# Patient Record
Sex: Female | Born: 1975 | Race: White | Hispanic: No | Marital: Married | Smoking: Never smoker
Health system: Southern US, Community
[De-identification: ages and names within clinical notes are randomized; demographics above are authoritative.]

## PROBLEM LIST (undated history)

## (undated) DIAGNOSIS — M2011 Hallux valgus (acquired), right foot: Secondary | ICD-10-CM

## (undated) DIAGNOSIS — N8110 Cystocele, unspecified: Secondary | ICD-10-CM

## (undated) DIAGNOSIS — K219 Gastro-esophageal reflux disease without esophagitis: Secondary | ICD-10-CM

## (undated) DIAGNOSIS — N838 Other noninflammatory disorders of ovary, fallopian tube and broad ligament: Secondary | ICD-10-CM

## (undated) DIAGNOSIS — N816 Rectocele: Secondary | ICD-10-CM

## (undated) HISTORY — DX: Gastro-esophageal reflux disease without esophagitis: K21.9

## (undated) HISTORY — PX: OTHER SURGICAL HISTORY: SHX169

## (undated) HISTORY — PX: ABDOMINAL HYSTERECTOMY: SHX81

---

## 2021-07-22 ENCOUNTER — Ambulatory Visit (INDEPENDENT_AMBULATORY_CARE_PROVIDER_SITE_OTHER): Payer: BC Managed Care – PPO | Admitting: Obstetrics

## 2021-07-22 ENCOUNTER — Other Ambulatory Visit (HOSPITAL_COMMUNITY)
Admission: RE | Admit: 2021-07-22 | Discharge: 2021-07-22 | Disposition: A | Discharge status: Home / Self Care | Payer: BC Managed Care – PPO | Source: Ambulatory Visit | Attending: Obstetrics | Admitting: Obstetrics

## 2021-07-22 ENCOUNTER — Encounter: Payer: Self-pay | Admitting: Obstetrics

## 2021-07-22 ENCOUNTER — Other Ambulatory Visit: Payer: Self-pay

## 2021-07-22 VITALS — BP 100/70 | Ht 60.0 in | Wt 116.0 lb

## 2021-07-22 DIAGNOSIS — Z124 Encounter for screening for malignant neoplasm of cervix: Secondary | ICD-10-CM | POA: Diagnosis present

## 2021-07-22 DIAGNOSIS — Z01419 Encounter for gynecological examination (general) (routine) without abnormal findings: Secondary | ICD-10-CM

## 2021-07-22 DIAGNOSIS — Z1231 Encounter for screening mammogram for malignant neoplasm of breast: Secondary | ICD-10-CM

## 2021-07-22 DIAGNOSIS — Z0000 Encounter for general adult medical examination without abnormal findings: Secondary | ICD-10-CM | POA: Diagnosis not present

## 2021-07-22 DIAGNOSIS — R5383 Other fatigue: Secondary | ICD-10-CM

## 2021-07-22 NOTE — Progress Notes (Signed)
Gynecology Annual Exam  PCP: Pcp, No  Chief Complaint:  Chief Complaint  Patient presents with   Annual Exam    History of Present Illness: Patient is a 45 y.o. [id number] presents for annual exam. The patient has no complaints today.  Mallory Jenkins moved to the area from [place] a few months ago. 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 She is married and has two sons who are college age. In 2021, she had a partial hysterectomy ( has her ovaries) after an endometrial biposy revealed abnormal cells. She is physically active and runs several days /weekly, but shares that this year she has begun to feel tired and not as in shape. She has not yest extablished care with a PCP.  LMP: No LMP recorded.   The patient is sexually active. She currently uses status post hysterectomy for contraception. She denies dyspareunia.  The patient does perform self breast exams.  There is no notable family history of breast or ovarian cancer in her family.  The patient wears seatbelts: yes.   The patient has regular exercise: yes.    The patient denies current symptoms of depression.    Review of Systems: Review of Systems  Constitutional: Negative.   HENT: Negative.    Eyes: Negative.   Respiratory: Negative.    Cardiovascular: Negative.   Gastrointestinal: Negative.   Musculoskeletal: Negative.   Skin: Negative.   Neurological: Negative.   Endo/Heme/Allergies: Negative.   Psychiatric/Behavioral: Negative.     Past Medical History:  There are no problems to display for this patient.   Past Surgical History:  Past Surgical History:  Procedure Laterality Date   ABDOMINAL HYSTERECTOMY      Gynecologic History:  No LMP recorded. Contraception: status post hysterectomy Last Pap: Results were: 2020  results unavailable   Last mammogram: 2021 Results were: BI-RAD I  Obstetric History: [id number]  Family History:  History reviewed. No pertinent family history.  Social  History:  Social History   Socioeconomic History   Marital status: Married    Spouse name: Not on file   Number of children: Not on file   Years of education: Not on file   Highest education level: Not on file  Occupational History   Not on file  Tobacco Use   Smoking status: Never   Smokeless tobacco: Never  Vaping Use   Vaping Use: Never used  Substance and Sexual Activity   Alcohol use: Never   Drug use: Never   Sexual activity: Yes  Other Topics Concern   Not on file  Social History Narrative   Not on file   Social Determinants of Health   Financial Resource Strain: Not on file  Food Insecurity: Not on file  Transportation Needs: Not on file  Physical Activity: Not on file  Stress: Not on file  Social Connections: Not on file  Intimate Partner Violence: Not on file    Allergies:  No Known Allergies  Medications: Prior to Admission medications   Not on File    Physical Exam Vitals: Blood pressure 100/70, height 5' (1.524 m), weight 116 lb (52.6 kg).  General: NAD HEENT: normocephalic, anicteric Thyroid: no enlargement, no palpable nodules Pulmonary: No increased work of breathing, CTAB Cardiovascular: RRR, distal pulses 2+ Breast: Breast symmetrical, no tenderness, no palpable nodules or masses, no skin or nipple retraction present, no nipple discharge.  No axillary or supraclavicular lymphadenopathy. Abdomen: NABS, soft, non-tender, non-distended.  Umbilicus without lesions.  No  hepatomegaly, splenomegaly or masses palpable. No evidence of hernia  Genitourinary:  External: Normal external female genitalia.  Normal urethral meatus, normal Bartholin's and Skene's glands.    Vagina: Normal vaginal mucosa, no evidence of prolapse.    Cervix: Grossly normal in appearance, no bleeding  Uterus: Non-enlarged, mobile, normal contour.  No CMT  Adnexa: ovaries non-enlarged, no adnexal masses  Rectal: deferred  Lymphatic: no evidence of inguinal  lymphadenopathy Extremities: no edema, erythema, or tenderness Neurologic: Grossly intact Psychiatric: mood appropriate, affect full  Female chaperone present for pelvic and breast  portions of the physical exam    Assessment: 45 y.o. G2P2002 routine annual exam  Plan: Problem List Items Addressed This Visit   None Visit Diagnoses     Healthcare maintenance    -  Primary   Relevant Orders   Comprehensive metabolic panel   Vitamin D (25 hydroxy)   TSH + free T4   CBC With Differential   Women's annual routine gynecological examination       Relevant Orders   Comprehensive metabolic panel   Vitamin D (25 hydroxy)   Cervical cancer screening       Relevant Orders   Cytology - PAP   Breast cancer screening by mammogram       Relevant Orders   MM DIGITAL SCREENING BILATERAL   Fatigue, unspecified type       Relevant Orders   TSH + free T4       1) Mammogram - recommend yearly screening mammogram.  Mammogram Was ordered today   2) STI screening  wasoffered and declined  3) ASCCP guidelines and rational discussed.  Patient opts for every 5 years screening interval  4) Contraception - the patient is currently using  status post hysterectomy.  She is happy with her current form of contraception and plans to continue  5) Colonoscopy -- Screening recommended starting at age 49 for average risk individuals, age 1 for individuals deemed at increased risk (including African Americans) and recommended to continue until age 87.  For patient age 22-85 individualized approach is recommended.  Gold standard screening is via colonoscopy, Cologuard screening is an acceptable alternative for patient unwilling or unable to undergo colonoscopy.  "Colorectal cancer screening for average?risk adults: 2018 guideline update from the American Cancer Society"CA: A Cancer Journal for Clinicians: Apr 19, 2017   6) Routine healthcare maintenance including cholesterol, diabetes screening discussed  Ordered today, including CBC, CMP, Thyroid panel and Vitamin D.  Will notify her of the results.  7) Return in about 1 year (around 07/22/2022) for annual.   Mirna Mires, CNM  07/22/2021 5:50 PM   [facility], [facility] 07/22/2021, 5:42 PM

## 2021-07-26 LAB — CBC WITH DIFFERENTIAL
Basophils Absolute: 0.1 10*3/uL (ref 0.0–0.2)
Basos: 1 %
EOS (ABSOLUTE): 0.2 10*3/uL (ref 0.0–0.4)
Eos: 2 %
Hematocrit: 44.9 % (ref 34.0–46.6)
Hemoglobin: 15 g/dL (ref 11.1–15.9)
Immature Grans (Abs): 0 10*3/uL (ref 0.0–0.1)
Immature Granulocytes: 0 %
Lymphocytes Absolute: 2.5 10*3/uL (ref 0.7–3.1)
Lymphs: 31 %
MCH: 30.4 pg (ref 26.6–33.0)
MCHC: 33.4 g/dL (ref 31.5–35.7)
MCV: 91 fL (ref 79–97)
Monocytes Absolute: 0.5 10*3/uL (ref 0.1–0.9)
Monocytes: 6 %
Neutrophils Absolute: 4.9 10*3/uL (ref 1.4–7.0)
Neutrophils: 60 %
RBC: 4.93 x10E6/uL (ref 3.77–5.28)
RDW: 11.6 % — ABNORMAL LOW (ref 11.7–15.4)
WBC: 8.1 10*3/uL (ref 3.4–10.8)

## 2021-07-26 LAB — COMPREHENSIVE METABOLIC PANEL
ALT: 18 IU/L (ref 0–32)
AST: 22 IU/L (ref 0–40)
Albumin/Globulin Ratio: 1.9 (ref 1.2–2.2)
Albumin: 4.5 g/dL (ref 3.8–4.8)
Alkaline Phosphatase: 55 IU/L (ref 44–121)
BUN/Creatinine Ratio: 23 (ref 9–23)
BUN: 16 mg/dL (ref 6–24)
Bilirubin Total: 0.4 mg/dL (ref 0.0–1.2)
CO2: 22 mmol/L (ref 20–29)
Calcium: 9.5 mg/dL (ref 8.7–10.2)
Chloride: 101 mmol/L (ref 96–106)
Creatinine, Ser: 0.71 mg/dL (ref 0.57–1.00)
Globulin, Total: 2.4 g/dL (ref 1.5–4.5)
Glucose: 82 mg/dL (ref 65–99)
Potassium: 3.8 mmol/L (ref 3.5–5.2)
Sodium: 139 mmol/L (ref 134–144)
Total Protein: 6.9 g/dL (ref 6.0–8.5)
eGFR: 105 mL/min/{1.73_m2} (ref >59)

## 2021-07-26 LAB — TSH+FREE T4
Free T4: 1.03 ng/dL (ref 0.82–1.77)
TSH: 1.39 u[IU]/mL (ref 0.450–4.500)

## 2021-07-26 LAB — VITAMIN D 25 HYDROXY (VIT D DEFICIENCY, FRACTURES): Vit D, 25-Hydroxy: 35.2 ng/mL (ref 30.0–100.0)

## 2021-07-28 ENCOUNTER — Encounter: Payer: Self-pay | Admitting: Obstetrics

## 2021-07-28 LAB — CYTOLOGY - PAP
Comment: NEGATIVE
Diagnosis: NEGATIVE
High risk HPV: NEGATIVE

## 2021-08-11 ENCOUNTER — Other Ambulatory Visit: Payer: Self-pay | Admitting: Obstetrics

## 2021-08-11 DIAGNOSIS — Z1231 Encounter for screening mammogram for malignant neoplasm of breast: Secondary | ICD-10-CM

## 2021-08-24 ENCOUNTER — Ambulatory Visit
Admission: RE | Admit: 2021-08-24 | Discharge: 2021-08-24 | Disposition: A | Discharge status: Home / Self Care | Payer: BC Managed Care – PPO | Source: Ambulatory Visit | Attending: Obstetrics | Admitting: Obstetrics

## 2021-08-24 ENCOUNTER — Other Ambulatory Visit: Payer: Self-pay

## 2021-08-24 DIAGNOSIS — Z1231 Encounter for screening mammogram for malignant neoplasm of breast: Secondary | ICD-10-CM | POA: Diagnosis not present

## 2021-09-01 ENCOUNTER — Encounter: Payer: Self-pay | Admitting: Obstetrics

## 2021-11-17 ENCOUNTER — Emergency Department: Payer: BC Managed Care – PPO

## 2021-11-17 ENCOUNTER — Ambulatory Visit
Admission: EM | Admit: 2021-11-17 | Discharge: 2021-11-17 | Disposition: A | Discharge status: Home / Self Care | Payer: BC Managed Care – PPO | Attending: Emergency Medicine | Admitting: Emergency Medicine

## 2021-11-17 ENCOUNTER — Emergency Department: Payer: BC Managed Care – PPO | Admitting: Anesthesiology

## 2021-11-17 ENCOUNTER — Encounter: Payer: Self-pay | Admitting: Emergency Medicine

## 2021-11-17 ENCOUNTER — Other Ambulatory Visit: Payer: Self-pay

## 2021-11-17 ENCOUNTER — Encounter
Admission: EM | Disposition: A | Discharge status: Home / Self Care | Payer: Self-pay | Source: Home / Self Care | Attending: Student in an Organized Health Care Education/Training Program

## 2021-11-17 DIAGNOSIS — K209 Esophagitis, unspecified without bleeding: Secondary | ICD-10-CM | POA: Diagnosis not present

## 2021-11-17 DIAGNOSIS — X58XXXA Exposure to other specified factors, initial encounter: Secondary | ICD-10-CM | POA: Diagnosis not present

## 2021-11-17 DIAGNOSIS — Z20822 Contact with and (suspected) exposure to covid-19: Secondary | ICD-10-CM | POA: Diagnosis not present

## 2021-11-17 DIAGNOSIS — K3189 Other diseases of stomach and duodenum: Secondary | ICD-10-CM | POA: Insufficient documentation

## 2021-11-17 DIAGNOSIS — T18128A Food in esophagus causing other injury, initial encounter: Secondary | ICD-10-CM | POA: Insufficient documentation

## 2021-11-17 DIAGNOSIS — K2289 Other specified disease of esophagus: Secondary | ICD-10-CM | POA: Insufficient documentation

## 2021-11-17 HISTORY — PX: ESOPHAGOGASTRODUODENOSCOPY (EGD) WITH PROPOFOL: SHX5813

## 2021-11-17 LAB — RESP PANEL BY RT-PCR (FLU A&B, COVID) ARPGX2
Influenza A by PCR: NEGATIVE
Influenza B by PCR: NEGATIVE
SARS Coronavirus 2 by RT PCR: NEGATIVE

## 2021-11-17 SURGERY — ESOPHAGOGASTRODUODENOSCOPY (EGD) WITH PROPOFOL
Anesthesia: General

## 2021-11-17 MED ORDER — LIDOCAINE HCL (CARDIAC) PF 100 MG/5ML IV SOSY
PREFILLED_SYRINGE | INTRAVENOUS | Status: DC | PRN
  Administered 2021-11-17: 50 mg via INTRAVENOUS

## 2021-11-17 MED ORDER — ONDANSETRON HCL 4 MG/2ML IJ SOLN
INTRAMUSCULAR | Status: DC | PRN
  Administered 2021-11-17: 4 mg via INTRAVENOUS

## 2021-11-17 MED ORDER — ROCURONIUM BROMIDE 10 MG/ML (PF) SYRINGE
PREFILLED_SYRINGE | INTRAVENOUS | Status: AC
  Filled 2021-11-17: qty 10

## 2021-11-17 MED ORDER — ROCURONIUM BROMIDE 100 MG/10ML IV SOLN
INTRAVENOUS | Status: DC | PRN
Start: 2021-11-17 — End: 2021-11-17
  Administered 2021-11-17: 20 mg via INTRAVENOUS

## 2021-11-17 MED ORDER — GLUCAGON HCL (RDNA) 1 MG IJ SOLR
1.0000 mg | Freq: Once | INTRAMUSCULAR | Status: AC
  Administered 2021-11-17: 18:00:00 1 mg via INTRAVENOUS
  Filled 2021-11-17 (×2): qty 1

## 2021-11-17 MED ORDER — SUGAMMADEX SODIUM 200 MG/2ML IV SOLN
INTRAVENOUS | Status: DC | PRN
Start: 2021-11-17 — End: 2021-11-17
  Administered 2021-11-17: 150 mg via INTRAVENOUS

## 2021-11-17 MED ORDER — SUCCINYLCHOLINE CHLORIDE 200 MG/10ML IV SOSY
PREFILLED_SYRINGE | INTRAVENOUS | Status: DC | PRN
  Administered 2021-11-17: 50 mg via INTRAVENOUS

## 2021-11-17 MED ORDER — PROPOFOL 10 MG/ML IV BOLUS
INTRAVENOUS | Status: AC
  Filled 2021-11-17: qty 20

## 2021-11-17 MED ORDER — ONDANSETRON HCL 4 MG/2ML IJ SOLN
4.0000 mg | Freq: Once | INTRAMUSCULAR | Status: AC
  Administered 2021-11-17: 18:00:00 4 mg via INTRAVENOUS
  Filled 2021-11-17: qty 2

## 2021-11-17 MED ORDER — SODIUM CHLORIDE 0.9 % IV BOLUS
500.0000 mL | Freq: Once | INTRAVENOUS | Status: AC
  Administered 2021-11-17: 18:00:00 500 mL via INTRAVENOUS

## 2021-11-17 MED ORDER — PROPOFOL 10 MG/ML IV BOLUS
INTRAVENOUS | Status: DC | PRN
  Administered 2021-11-17: 130 mg via INTRAVENOUS
  Administered 2021-11-17: 30 mg via INTRAVENOUS

## 2021-11-17 MED ORDER — DEXMEDETOMIDINE HCL IN NACL 200 MCG/50ML IV SOLN
INTRAVENOUS | Status: AC
  Filled 2021-11-17: qty 50

## 2021-11-17 MED ORDER — SODIUM CHLORIDE 0.9 % IV SOLN: INTRAVENOUS | Status: DC

## 2021-11-17 MED ORDER — DEXMEDETOMIDINE HCL IN NACL 200 MCG/50ML IV SOLN
INTRAVENOUS | Status: DC | PRN
  Administered 2021-11-17: 12 ug via INTRAVENOUS

## 2021-11-17 NOTE — ED Provider Notes (Signed)
°  Emergency Medicine Provider Triage Evaluation Note  Mallory Jenkins , a 45 y.o.female,  was evaluated in triage.  Pt complains of esophageal foreign body.  Patient states that she was eating chicken last night, when she felt it get stuck in her throat.  Denies fever/chills, shortness of breath, chest pain, or nausea   Review of Systems  Positive: Esophageal discomfort Negative: Denies fever, chest pain, vomiting  Physical Exam   Vitals:   11/17/21 1333  BP: (!) 161/66  Pulse: 62  Resp: 16  Temp: 97.9 F (36.6 C)  SpO2: 93%   Gen:   Awake, no distress   Resp:  Normal effort  MSK:   Moves extremities without difficulty  Other:    Medical Decision Making  Given the patient's initial medical screening exam, the following diagnostic evaluation has been ordered. The patient will be placed in the appropriate treatment space, once one is available, to complete the evaluation and treatment. I have discussed the plan of care with the patient and I have advised the patient that an ED physician or mid-level practitioner will reevaluate their condition after the test results have been received, as the results may give them additional insight into the type of treatment they may need.    Diagnostics: Soft tissue neck x-ray  Treatments: none immediately   Varney Daily, PA 11/17/21 1347    Delton Prairie, MD 11/17/21 1734

## 2021-11-17 NOTE — Transfer of Care (Signed)
Immediate Anesthesia Transfer of Care Note  Patient: Mallory Jenkins  Procedure(s) Performed: ESOPHAGOGASTRODUODENOSCOPY (EGD) WITH PROPOFOL  Patient Location: PACU  Anesthesia Type:General  Level of Consciousness: drowsy and patient cooperative  Airway & Oxygen Therapy: Patient Spontanous Breathing  Post-op Assessment: Report given to RN and Post -op Vital signs reviewed and stable  Post vital signs: Reviewed and stable  Last Vitals:  Vitals Value Taken Time  BP 119/80 11/17/21 2009  Temp    Pulse 86 11/17/21 2009  Resp 18 11/17/21 2009  SpO2 98 % 11/17/21 2009  Vitals shown include unvalidated device data.  Last Pain:  Vitals:   11/17/21 1920  TempSrc: Temporal         Complications: No notable events documented.

## 2021-11-17 NOTE — ED Notes (Signed)
Pt has chicken stuck in her throat since 1830 yesterday.  No resp distress.  Pt unable to keep food or drinks down.  Hx of same problem,  pt alert, in hallway bed.

## 2021-11-17 NOTE — ED Notes (Signed)
Report given to lori rn endo nurse

## 2021-11-17 NOTE — Progress Notes (Signed)
Patient awake/alert x4. Able to tolerated water without event,states "so much better" Afebrile. Reviewed endo report with patient, GI RN reviewed with patient's husband. 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 Reviewed protonix 40mg  bid for 12weeks, and repeat EGD in 12 weeks.

## 2021-11-17 NOTE — ED Notes (Signed)
Pt transported to OR for endoscopy by OR endo RN Hilbert Corrigan,

## 2021-11-17 NOTE — ED Triage Notes (Signed)
Presents with possible food bolus  feels like something is stuck in throat

## 2021-11-17 NOTE — Anesthesia Procedure Notes (Signed)
Procedure Name: Intubation Date/Time: 11/17/2021 7:41 PM Performed by: Omer Jack, CRNA Pre-anesthesia Checklist: Patient identified, Patient being monitored, Timeout performed, Emergency Drugs available and Suction available Patient Re-evaluated:Patient Re-evaluated prior to induction Oxygen Delivery Method: Circle system utilized Preoxygenation: Pre-oxygenation with 100% oxygen Induction Type: IV induction Ventilation: Mask ventilation without difficulty Laryngoscope Size: 3 and McGraph Grade View: Grade I Tube type: Oral Tube size: 7.0 mm Number of attempts: 1 Airway Equipment and Method: Stylet Placement Confirmation: ETT inserted through vocal cords under direct vision, positive ETCO2 and breath sounds checked- equal and bilateral Secured at: 21 cm Tube secured with: Tape Dental Injury: Teeth and Oropharynx as per pre-operative assessment

## 2021-11-17 NOTE — Consult Note (Signed)
Consultation  Referring Provider:  ED   Admit date: 12/28 Consult date: 12/28         Reason for Consultation:   Food Bolus           HPI:   Mallory Jenkins is a 45 y.o. lady with history of EoE diagnosed in  about two years ago. Had an episode of food impaction a few months ago she vomited up. She was prescribed protonix but stopped taking it. Had a piece of chicken last night that got stook and has been unable to tolerate any PO intake. No blood thinners. No family history of GI issues.  History reviewed. No pertinent past medical history.  Past Surgical History:  Procedure Laterality Date   ABDOMINAL HYSTERECTOMY      History reviewed. No pertinent family history.   Social History   Tobacco Use   Smoking status: Never   Smokeless tobacco: Never  Vaping Use   Vaping Use: Never used  Substance Use Topics   Alcohol use: Never   Drug use: Never    Prior to Admission medications   Not on File    Current Facility-Administered Medications  Medication Dose Route Frequency Provider Last Rate Last Admin   0.9 %  sodium chloride infusion   Intravenous Continuous Jaymie Misch, Rossie Muskrat, MD       No current outpatient medications on file.    Allergies as of 11/17/2021   (No Known Allergies)     Review of Systems:    All systems reviewed and negative except where noted in HPI.  Review of Systems  Constitutional:  Negative for chills and fever.  Respiratory:  Negative for shortness of breath.   Cardiovascular:  Negative for chest pain.  Gastrointestinal:  Positive for nausea.  Neurological:  Negative for focal weakness.  Psychiatric/Behavioral:  Negative for substance abuse.   All other systems reviewed and are negative.     Physical Exam:  Vital signs in last 24 hours: Temp:  [97.9 F (36.6 C)-98.8 F (37.1 C)] 98.8 F (37.1 C) (12/28 1920) Pulse Rate:  [62-93] 86 (12/28 1920) Resp:  [16] 16 (12/28 1920) BP: (122-161)/(66-90) 125/90 (12/28 1920) SpO2:   [93 %-100 %] 100 % (12/28 1920) Weight:  [52.6 kg] 52.6 kg (12/28 1308)   General:   Pleasant in NAD Head:  Normocephalic and atraumatic. Eyes:   No icterus.   Conjunctiva pink. Mouth: Mucosa pink moist, no lesions. Neck:  Supple; no masses felt Lungs:  No respiratory distress Abdomen:   Flat, soft, nondistended, nontender Msk:   No clubbing or cyanosis. Strength 5/5 Neurologic:  Alert and  oriented x4;  Cranial nerves II-XII intact.  Skin:  Warm, dry, pink without significant lesions or rashes. Psych:  Alert and cooperative. Normal affect.  LAB RESULTS: No results for input(s): WBC, HGB, HCT, PLT in the last 72 hours. BMET No results for input(s): NA, K, CL, CO2, GLUCOSE, BUN, CREATININE, CALCIUM in the last 72 hours. LFT No results for input(s): PROT, ALBUMIN, AST, ALT, ALKPHOS, BILITOT, BILIDIR, IBILI in the last 72 hours. PT/INR No results for input(s): LABPROT, INR in the last 72 hours.  STUDIES: DG Neck Soft Tissue  Result Date: 11/17/2021 CLINICAL DATA:  Possible foreign body EXAM: NECK SOFT TISSUES - 1+ VIEW COMPARISON:  None. FINDINGS: There is no evidence of retropharyngeal soft tissue swelling or epiglottic enlargement. The cervical airway is unremarkable and no radio-opaque foreign body identified. IMPRESSION: No radiopaque foreign body identified. Electronically Signed   By:  Jannifer Hick M.D.   On: 11/17/2021 14:07       Impression / Plan:   45 y/o lady with history of EoE who presents with suspected food impaction  - Plan for EGD now - further recs after procedure   Merlyn Lot MD, MPH [facility]

## 2021-11-17 NOTE — ED Provider Notes (Signed)
[facility] Emergency Department Provider Note    Event Date/Time   First MD Initiated Contact with Patient 11/17/21 1734     (approximate)  I have reviewed the triage vital signs and the nursing notes.   HISTORY  Chief Complaint Foreign Body    HPI Mallory Jenkins is a 46 y.o. female with a history of eosinophilic esophagitis diagnosed in [place] presents to the ER with concern for food bolus impaction she states that she was eating chicken last night and felt something is stuck in her throat.  She has dealt with this in the past she tried to wait it out to drink fluids and try to vomit it up but after waiting throughout the day and still not able to tolerate liquids she came to the ER.  Denies any shortness of breath feels something stuck behind her breastbone has been consistent since she was eating the chicken.  She is on any blood thinners.  History reviewed. No pertinent past medical history. History reviewed. No pertinent family history. Past Surgical History:  Procedure Laterality Date   ABDOMINAL HYSTERECTOMY     There are no problems to display for this patient.     Prior to Admission medications   Not on File    Allergies Patient has no known allergies.    Social History Social History   Tobacco Use   Smoking status: Never   Smokeless tobacco: Never  Vaping Use   Vaping Use: Never used  Substance Use Topics   Alcohol use: Never   Drug use: Never    Review of Systems Patient denies headaches, rhinorrhea, blurry vision, numbness, shortness of breath, chest pain, edema, cough, abdominal pain, nausea, vomiting, diarrhea, dysuria, fevers, rashes or hallucinations unless otherwise stated above in HPI. ____________________________________________   PHYSICAL EXAM:  VITAL SIGNS: Vitals:   11/17/21 1333 11/17/21 1645  BP: (!) 161/66 122/83  Pulse: 62 93  Resp: 16 16  Temp: 97.9 F (36.6 C)   SpO2: 93% 98%     Constitutional: Alert and oriented.  Eyes: Conjunctivae are normal.  Head: Atraumatic. Nose: No congestion/rhinnorhea. Mouth/Throat: Mucous membranes are moist.   Neck: No stridor. Painless ROM.  Cardiovascular: Normal rate, regular rhythm. Grossly normal heart sounds.  Good peripheral circulation. Respiratory: Normal respiratory effort.  No retractions. Lungs CTAB. Gastrointestinal: Soft and nontender. No distention. No abdominal bruits. No CVA tenderness. Genitourinary:  Musculoskeletal: No lower extremity tenderness nor edema.  No joint effusions. Neurologic:  Normal speech and language. No gross focal neurologic deficits are appreciated. No facial droop Skin:  Skin is warm, dry and intact. No rash noted. Psychiatric: Mood and affect are normal. Speech and behavior are normal.  ____________________________________________   LABS (all labs ordered are listed, but only abnormal results are displayed)  No results found for this or any previous visit (from the past 24 hour(s)). ____________________________________________ ____________________________________________  RADIOLOGY   ____________________________________________   PROCEDURES  Procedure(s) performed:  Procedures    Critical Care performed: no ____________________________________________   INITIAL IMPRESSION / ASSESSMENT AND PLAN / ED COURSE  Pertinent labs & imaging results that were available during my care of the patient were reviewed by me and considered in my medical decision making (see chart for details).   DDX: fbi, esophagitis, eoe, ptx  Mallory Jenkins is a 45 y.o. who presents to the ED with symptoms consistent with food bolus impaction by history and given her history of the same with similar symptoms.  Have consulted GI, dr.  Locklear, who will evaluate patient.  Will give IV fluids as she does feel dehydrated will give a trial of glucagon while awaiting GI consultation  Clinical Course as of  11/17/21 1849  Wed Nov 17, 2021  1848 Patient felt some change in symptoms after glucagon and therefore we attempted a p.o. challenge again but patient is still having liquid coming back up.  She is currently protecting her airway [PR]    Clinical Course User Index [PR] Willy Eddy, MD    The patient was evaluated in Emergency Department today for the symptoms described in the history of present illness. He/she was evaluated in the context of the global COVID-19 pandemic, which necessitated consideration that the patient might be at risk for infection with the SARS-CoV-2 virus that causes COVID-19. Institutional protocols and algorithms that pertain to the evaluation of patients at risk for COVID-19 are in a state of rapid change based on information released by regulatory bodies including the CDC and federal and state organizations. These policies and algorithms were followed during the patient's care in the ED.  As part of my medical decision making, I reviewed the following data within the electronic MEDICAL RECORD NUMBER Nursing notes reviewed and incorporated, Labs reviewed, notes from prior ED visits and [place] Controlled Substance Database   ____________________________________________   FINAL CLINICAL IMPRESSION(S) / ED DIAGNOSES  Final diagnoses:  Food impaction of esophagus, initial encounter      NEW MEDICATIONS STARTED DURING THIS VISIT:  New Prescriptions   No medications on file     Note:  This document was prepared using Dragon voice recognition software and may include unintentional dictation errors.    Willy Eddy, MD 11/17/21 [phone]

## 2021-11-17 NOTE — ED Notes (Signed)
Pt unable to keep ginger ale down.

## 2021-11-17 NOTE — Op Note (Signed)
[facility] Gastroenterology Patient Name: Mallory Jenkins Procedure Date: 11/17/2021 7:39 PM MRN: [id number] Account #: [id number] Date of Birth: Sep 27, 1976 Admit Type: Outpatient Age: 45 Room: [facility] ENDO ROOM 4 Gender: Female Note Status: Finalized Instrument Name: Altamese Cabal Endoscope [id number] Procedure:             Upper GI endoscopy Indications:           Foreign body in the esophagus Providers:             Andrey Farmer MD, MD Medicines:             General Anesthesia Complications:         No immediate complications. Estimated blood loss:                         Minimal. Procedure:             Pre-Anesthesia Assessment:                        - Prior to the procedure, a History and Physical was                         performed, and patient medications and allergies were                         reviewed. The patient is competent. The risks and                         benefits of the procedure and the sedation options and                         risks were discussed with the patient. All questions                         were answered and informed consent was obtained.                         Patient identification and proposed procedure were                         verified by the physician, the nurse, the                         anesthesiologist, the anesthetist and the technician                         in the endoscopy suite. Mental Status Examination:                         alert and oriented. Airway Examination: normal                         oropharyngeal airway and neck mobility. Respiratory                         Examination: clear to auscultation. CV Examination:                         normal. Prophylactic Antibiotics: The patient does not  require prophylactic antibiotics. Prior                         Anticoagulants: The patient has taken no previous                         anticoagulant or antiplatelet agents. ASA Grade                          Assessment: E - Emergency. After reviewing the risks                         and benefits, the patient was deemed in satisfactory                         condition to undergo the procedure. The anesthesia                         plan was to use monitored anesthesia care (MAC).                         Immediately prior to administration of medications,                         the patient was re-assessed for adequacy to receive                         sedatives. The heart rate, respiratory rate, oxygen                         saturations, blood pressure, adequacy of pulmonary                         ventilation, and response to care were monitored                         throughout the procedure. The physical status of the                         patient was re-assessed after the procedure.                        After obtaining informed consent, the endoscope was                         passed under direct vision. Throughout the procedure,                         the patient's blood pressure, pulse, and oxygen                         saturations were monitored continuously. The Endoscope                         was introduced through the mouth, and advanced to the                         second part of duodenum. The upper GI endoscopy was  accomplished without difficulty. The patient tolerated                         the procedure well. Findings:      Food was found in the lower third of the esophagus. Removal of food was       accomplished using a roth net and then gentle pressure to push remaining       amount into the stomach.      Multiple 2 mm plaques were found in the middle third of the esophagus.       These can be biopsied on f/u endoscopy      LA Grade D (one or more mucosal breaks involving at least 75% of       esophageal circumference) esophagitis with bleeding was found.      The entire examined stomach was normal.      Patchy  atrophic mucosa was found in the entire duodenum. This can be       biopsied on f/u endoscopy. Impression:            - Food in the lower third of the esophagus. Removal                         was successful.                        - Multiple plaques in the middle third of the                         esophagus.                        - LA Grade D esophagitis with bleeding.                        - Normal stomach.                        - Duodenal mucosal atrophy. Recommendation:        - Discharge patient to home.                        - Soft diet for 2 days.                        - Use Protonix (pantoprazole) 40 mg PO BID for 12                         weeks.                        - Repeat upper endoscopy in 12 weeks to check healing                         and to assess disease activity.                        - Take small bites. Chew food thoroughly. Procedure Code(s):     --- Professional ---                        [phone], Esophagogastroduodenoscopy, flexible,  transoral; with removal of foreign body(s) Diagnosis Code(s):     --- Professional ---                        M33.612A, Food in esophagus causing other injury,                         initial encounter                        K22.8, Other specified diseases of esophagus                        K20.91, Esophagitis, unspecified with bleeding                        K31.89, Other diseases of stomach and duodenum                        T18.108A, Unspecified foreign body in esophagus                         causing other injury, initial encounter CPT copyright 2019 American Medical Association. All rights reserved. The codes documented in this report are preliminary and upon coder review may  be revised to meet current compliance requirements. Andrey Farmer MD, MD 11/17/2021 8:12:55 PM Number of Addenda: 0 Note Initiated On: 11/17/2021 7:39 PM Estimated Blood Loss:  Estimated blood loss was minimal.       [facility]

## 2021-11-17 NOTE — Anesthesia Postprocedure Evaluation (Signed)
Anesthesia Post Note  Patient: Visual merchandiser  Procedure(s) Performed: ESOPHAGOGASTRODUODENOSCOPY (EGD) WITH PROPOFOL  Patient location during evaluation: PACU Anesthesia Type: General Level of consciousness: awake and alert Pain management: pain level controlled Vital Signs Assessment: post-procedure vital signs reviewed and stable Respiratory status: spontaneous breathing, nonlabored ventilation and respiratory function stable Cardiovascular status: blood pressure returned to baseline and stable Postop Assessment: no apparent nausea or vomiting Anesthetic complications: no   No notable events documented.   Last Vitals:  Vitals:   11/17/21 2015 11/17/21 2025  BP: 107/76 119/80  Pulse: 84 82  Resp: 16 16  Temp: 36.6 C 36.4 C  SpO2: 100% 99%    Last Pain:  Vitals:   11/17/21 2025  TempSrc:   PainSc: 0-No pain                 Foye Deer

## 2021-11-17 NOTE — Anesthesia Preprocedure Evaluation (Addendum)
Anesthesia Evaluation  Patient identified by MRN, date of birth, ID band Patient awake    Reviewed: Allergy & Precautions, NPO status , Patient's Chart, lab work & pertinent test results  Airway Mallampati: II  TM Distance: >3 FB Neck ROM: Full    Dental no notable dental hx.    Pulmonary neg pulmonary ROS,    Pulmonary exam normal breath sounds clear to auscultation       Cardiovascular negative cardio ROS Normal cardiovascular exam Rhythm:Regular Rate:Normal     Neuro/Psych negative neurological ROS  negative psych ROS   GI/Hepatic Neg liver ROS,   Endo/Other  negative endocrine ROS  Renal/GU negative Renal ROS  negative genitourinary   Musculoskeletal negative musculoskeletal ROS (+)   Abdominal   Peds negative pediatric ROS (+)  Hematology negative hematology ROS (+)   Anesthesia Other Findings Food bolus Pt has a history of eosinophilic esophagitis diagnosed in [place] presents to the ER with concern for food bolus impaction she states that she was eating chicken last night and felt something is stuck in her throat. She does not tolerate liquids.  Reproductive/Obstetrics negative OB ROS                             Anesthesia Physical Anesthesia Plan  ASA: 1  Anesthesia Plan: General   Post-op Pain Management:    Induction: Intravenous and Rapid sequence  PONV Risk Score and Plan: Ondansetron  Airway Management Planned: Oral ETT  Additional Equipment:   Intra-op Plan:   Post-operative Plan:   Informed Consent: I have reviewed the patients History and Physical, chart, labs and discussed the procedure including the risks, benefits and alternatives for the proposed anesthesia with the patient or authorized representative who has indicated his/her understanding and acceptance.     Dental advisory given  Plan Discussed with: CRNA and Anesthesiologist  Anesthesia  Plan Comments:         Anesthesia Quick Evaluation

## 2021-11-18 ENCOUNTER — Encounter: Payer: Self-pay | Admitting: Gastroenterology

## 2022-01-26 ENCOUNTER — Other Ambulatory Visit: Payer: Self-pay

## 2022-01-26 ENCOUNTER — Ambulatory Visit: Payer: Self-pay | Admitting: Gastroenterology

## 2022-01-26 ENCOUNTER — Encounter: Payer: Self-pay | Admitting: Gastroenterology

## 2022-01-26 ENCOUNTER — Ambulatory Visit (INDEPENDENT_AMBULATORY_CARE_PROVIDER_SITE_OTHER): Payer: BC Managed Care – PPO | Admitting: Gastroenterology

## 2022-01-26 VITALS — BP 108/72 | HR 76 | Temp 98.2°F | Ht 60.0 in | Wt 119.4 lb

## 2022-01-26 DIAGNOSIS — K200 Eosinophilic esophagitis: Secondary | ICD-10-CM | POA: Diagnosis not present

## 2022-01-26 DIAGNOSIS — Z1211 Encounter for screening for malignant neoplasm of colon: Secondary | ICD-10-CM | POA: Diagnosis not present

## 2022-01-26 NOTE — Progress Notes (Signed)
?  ?Wyline Mood MD, MRCP(U.K) ?[address]  ?[address]  ?[place], [place] [address]  ?Main: [phone]  ?Fax: [phone] ? ? ?Gastroenterology Consultation ? ?Referring Provider:     No ref. provider found ?Primary Care Physician:  Pcp, No ?Primary Gastroenterologist:  Dr. Wyline Mood  ?Reason for Consultation:     Dysphagua ?      ? HPI:   ?Mallory Jenkins is a 46 y.o. y/o female is here today to see me for dysphagia. She apparently has a history of EOE diagnosed in [place] 2 years back and came into the ER on 11/17/2021 with a piece of chicken stuck in her esophagus. Had an EGD by Dr Mia Creek : LA grade D esophagitis seen , biopsies of esophagus not take, food bolus taken out, she requested to be seen by me as an outpatient .  ? 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  She suffers from seasonal allergies does not have any clear food allergies.  She has not had a colonoscopy for colon cancer screening is due for 1.  No lower GI symptoms.  Has been taking omeprazole 40 mg twice a day and has not noticed any change in her symptoms. ? ?No past medical history on file. ? ?Past Surgical History:  ?Procedure Laterality Date  ? ABDOMINAL HYSTERECTOMY    ? ESOPHAGOGASTRODUODENOSCOPY (EGD) WITH PROPOFOL N/A 11/17/2021  ? Procedure: ESOPHAGOGASTRODUODENOSCOPY (EGD) WITH PROPOFOL;  Surgeon: Regis Bill, MD;  Location: [facility] ENDOSCOPY;  Service: Endoscopy;  Laterality: N/A;  ? ? ?Prior to Admission medications   ?Medication Sig Start Date End Date Taking? Authorizing Provider  ?pantoprazole (PROTONIX) 40 MG tablet Take 1 tablet by mouth in the morning and at bedtime. 11/23/21   [provider]  ? ? ?No family history on file.  ? ?Social History  ? ?Tobacco Use  ? Smoking status: Never  ? Smokeless tobacco: Never  ?Vaping Use  ? Vaping Use: Never used  ?Substance Use  Topics  ? Alcohol use: Never  ? Drug use: Never  ? ? ?Allergies as of 01/26/2022  ? (No Known Allergies)  ? ? ?Review of Systems:    ?All systems reviewed and negative except where noted in HPI. ? ? Physical Exam:  ?There were no vitals taken for this visit. ?No LMP recorded. Patient has had a hysterectomy. ?Psych:  Alert and cooperative. Normal mood and affect. ?General:   Alert,  Well-developed, well-nourished, pleasant and cooperative in NAD ?Head:  Normocephalic and atraumatic. ?Eyes:  Sclera clear, no icterus.   Conjunctiva pink. ?Ears:  Normal auditory acuity. ?Neurologic:  Alert and oriented x3;  grossly normal neurologically. ?Psych:  Alert and cooperative. Normal mood and affect. ? ?Imaging Studies: ?No results found. ? ?Assessment and Plan:  ? ?Mallory Jenkins is a 46 y.o. y/o female has been referred for history of eosinophilic esophagitis.  Recent presentation emergency room and December 2022 with a food impaction.  Not been treated for eosinophilic esophagitis or check for food allergies in the past.  Due for colon cancer screening colonoscopy.  No lower GI symptoms. ? ? ?Plan  ?EGD to obtain biopsies for eosinophilic esophagitis.  Will discuss about treatment after results.  Discussed about food allergy testing. ?Continue Prilosec 40 mg BID ? ?I have discussed alternative options, risks & benefits,  which include, but are not limited  to, bleeding, infection, perforation,respiratory complication & drug reaction.  The patient agrees with this plan & written consent will be obtained.   ? ? ?Follow up in 8-12 weeks  ? ?Dr Wyline Mood MD,MRCP(U.K) ?\ ?

## 2022-01-29 LAB — FOOD ALLERGY PROFILE
Allergen Corn, IgE: <0.1 kU/L
Clam IgE: <0.1 kU/L
Codfish IgE: <0.1 kU/L
Egg White IgE: <0.1 kU/L
Milk IgE: 0.13 kU/L — AB
Peanut IgE: <0.1 kU/L
Scallop IgE: <0.1 kU/L
Sesame Seed IgE: <0.1 kU/L
Shrimp IgE: <0.1 kU/L
Soybean IgE: <0.1 kU/L
Walnut IgE: <0.1 kU/L
Wheat IgE: <0.1 kU/L

## 2022-01-29 NOTE — Progress Notes (Signed)
Food allergy profile showed very mild possible allergy to milk but nothing much else

## 2022-01-31 ENCOUNTER — Telehealth: Payer: Self-pay

## 2022-01-31 NOTE — Telephone Encounter (Signed)
Called patient but had to leave her a detailed message letting her know that Dr. Tobi Bastos reviewed her labs and that the food allergy profile showed mild possibility allergy to milk but nothing else. I told her to call us if she had further questions. ?

## 2022-01-31 NOTE — Telephone Encounter (Signed)
-----   Message from Wyline Mood, MD sent at 01/29/2022  1:20 PM EST ----- ?Food allergy profile showed very mild possible allergy to milk but nothing much else  ?

## 2022-02-07 ENCOUNTER — Ambulatory Visit: Payer: Self-pay | Admitting: Adult Health

## 2022-04-05 ENCOUNTER — Telehealth: Payer: Self-pay | Admitting: Gastroenterology

## 2022-04-05 NOTE — Telephone Encounter (Signed)
PT left message to set appointment for colonoscopy ?

## 2022-04-06 ENCOUNTER — Telehealth: Payer: Self-pay

## 2022-04-06 ENCOUNTER — Other Ambulatory Visit: Payer: Self-pay

## 2022-04-06 DIAGNOSIS — K200 Eosinophilic esophagitis: Secondary | ICD-10-CM

## 2022-04-06 DIAGNOSIS — Z1211 Encounter for screening for malignant neoplasm of colon: Secondary | ICD-10-CM

## 2022-04-06 MED ORDER — NA SULFATE-K SULFATE-MG SULF 17.5-3.13-1.6 GM/177ML PO SOLN: 1.0000 | Freq: Once | ORAL | 0 refills | Status: AC

## 2022-04-06 NOTE — Telephone Encounter (Signed)
Gastroenterology Pre-Procedure Review ? ?Request Date: 05/13/22 ?Requesting Physician: Dr. Tobi Bastos ? ?PATIENT REVIEW QUESTIONS: The patient responded to the following health history questions as indicated:   ? ?1. Are you having any GI issues? eosinophilic esophagitis noted on office visit 01/26/22 Plan noted by Dr. Tobi Bastos, "EGD to obtain biopsies for eosinophilic esophagitis" ?2. Do you have a personal history of Polyps? no ?3. Do you have a family history of Colon Cancer or Polyps? no ?4. Diabetes Mellitus? no ?5. Joint replacements in the past 12 months?no ?6. Major health problems in the past 3 months?no ?7. Any artificial heart valves, MVP, or defibrillator?no ?   ?MEDICATIONS & ALLERGIES:    ?Patient reports the following regarding taking any anticoagulation/antiplatelet therapy:   ?Plavix, Coumadin, Eliquis, Xarelto, Lovenox, Pradaxa, Brilinta, or Effient? no ?Aspirin? no ? ?Patient confirms/reports the following medications:  ?Current Outpatient Medications  ?Medication Sig Dispense Refill  ? cetirizine (ZYRTEC) 10 MG tablet Take 10 mg by mouth daily as needed for allergies.    ? pantoprazole (PROTONIX) 40 MG tablet Take 1 tablet by mouth in the morning and at bedtime.    ? ?No current facility-administered medications for this visit.  ? ? ?Patient confirms/reports the following allergies:  ?No Known Allergies ? ?No orders of the defined types were placed in this encounter. ? ? ?AUTHORIZATION INFORMATION ?Primary Insurance: ?1D#: ?Group #: ? ?Secondary Insurance: ?1D#: ?Group #: ? ?SCHEDULE INFORMATION: ?Date: 05/13/22 ?Time: ?Location: [facility] ?

## 2022-04-25 ENCOUNTER — Other Ambulatory Visit: Payer: Self-pay

## 2022-04-25 ENCOUNTER — Encounter: Payer: Self-pay | Admitting: Gastroenterology

## 2022-04-25 MED ORDER — PANTOPRAZOLE SODIUM 40 MG PO TBEC: 40.0000 mg | DELAYED_RELEASE_TABLET | Freq: Two times a day (BID) | ORAL | 1 refills | Status: DC

## 2022-05-12 ENCOUNTER — Encounter: Payer: Self-pay | Admitting: Gastroenterology

## 2022-05-13 ENCOUNTER — Ambulatory Visit: Payer: No Typology Code available for payment source | Admitting: Anesthesiology

## 2022-05-13 ENCOUNTER — Ambulatory Visit
Admission: RE | Admit: 2022-05-13 | Discharge: 2022-05-13 | Disposition: A | Discharge status: Home / Self Care | Payer: No Typology Code available for payment source | Attending: Gastroenterology | Admitting: Gastroenterology

## 2022-05-13 ENCOUNTER — Encounter
Admission: RE | Disposition: A | Discharge status: Home / Self Care | Payer: Self-pay | Source: Home / Self Care | Attending: Gastroenterology

## 2022-05-13 DIAGNOSIS — Z09 Encounter for follow-up examination after completed treatment for conditions other than malignant neoplasm: Secondary | ICD-10-CM | POA: Diagnosis present

## 2022-05-13 DIAGNOSIS — K200 Eosinophilic esophagitis: Secondary | ICD-10-CM

## 2022-05-13 DIAGNOSIS — K2289 Other specified disease of esophagus: Secondary | ICD-10-CM | POA: Diagnosis not present

## 2022-05-13 DIAGNOSIS — Z1211 Encounter for screening for malignant neoplasm of colon: Secondary | ICD-10-CM | POA: Diagnosis not present

## 2022-05-13 HISTORY — PX: COLONOSCOPY WITH PROPOFOL: SHX5780

## 2022-05-13 HISTORY — PX: ESOPHAGOGASTRODUODENOSCOPY: SHX5428

## 2022-05-13 SURGERY — COLONOSCOPY WITH PROPOFOL
Anesthesia: General

## 2022-05-13 MED ORDER — LIDOCAINE 2% (20 MG/ML) 5 ML SYRINGE
INTRAMUSCULAR | Status: DC | PRN
  Administered 2022-05-13: 20 mg via INTRAVENOUS

## 2022-05-13 MED ORDER — MIDAZOLAM HCL 2 MG/2ML IJ SOLN
INTRAMUSCULAR | Status: AC
  Filled 2022-05-13: qty 2

## 2022-05-13 MED ORDER — MIDAZOLAM HCL 5 MG/5ML IJ SOLN
INTRAMUSCULAR | Status: DC | PRN
  Administered 2022-05-13: 2 mg via INTRAVENOUS

## 2022-05-13 MED ORDER — GLYCOPYRROLATE 0.2 MG/ML IJ SOLN
INTRAMUSCULAR | Status: DC | PRN
Start: 2022-05-13 — End: 2022-05-13
  Administered 2022-05-13: .2 mg via INTRAVENOUS

## 2022-05-13 MED ORDER — SODIUM CHLORIDE 0.9 % IV SOLN: INTRAVENOUS | Status: DC

## 2022-05-13 MED ORDER — PROPOFOL 500 MG/50ML IV EMUL
INTRAVENOUS | Status: DC | PRN
  Administered 2022-05-13: 120 ug/kg/min via INTRAVENOUS

## 2022-05-13 MED ORDER — PROPOFOL 10 MG/ML IV BOLUS
INTRAVENOUS | Status: DC | PRN
Start: 2022-05-13 — End: 2022-05-13
  Administered 2022-05-13: 70 mg via INTRAVENOUS
  Administered 2022-05-13: 30 mg via INTRAVENOUS

## 2022-05-16 ENCOUNTER — Encounter: Payer: Self-pay | Admitting: Gastroenterology

## 2022-05-16 LAB — SURGICAL PATHOLOGY

## 2022-05-20 ENCOUNTER — Telehealth: Payer: Self-pay | Admitting: Gastroenterology

## 2022-05-20 NOTE — Telephone Encounter (Signed)
Medical records were sent out on 05/19/2022 for colonoscopy on 05/13/2022 to [facility]

## 2022-05-20 NOTE — Telephone Encounter (Signed)
4pp[ '

## 2022-06-01 ENCOUNTER — Encounter: Payer: Self-pay | Admitting: Gastroenterology

## 2022-06-03 ENCOUNTER — Telehealth: Payer: Self-pay | Admitting: Gastroenterology

## 2022-06-03 NOTE — Telephone Encounter (Signed)
Pt medical records were sent to [facility] on 06/03/2022

## 2022-06-14 ENCOUNTER — Ambulatory Visit (INDEPENDENT_AMBULATORY_CARE_PROVIDER_SITE_OTHER): Payer: No Typology Code available for payment source | Admitting: Gastroenterology

## 2022-06-14 ENCOUNTER — Encounter: Payer: Self-pay | Admitting: Gastroenterology

## 2022-06-14 VITALS — BP 119/79 | HR 87 | Temp 98.7°F | Ht 60.0 in | Wt 120.4 lb

## 2022-06-14 DIAGNOSIS — K200 Eosinophilic esophagitis: Secondary | ICD-10-CM | POA: Diagnosis not present

## 2022-06-14 MED ORDER — PANTOPRAZOLE SODIUM 40 MG PO TBEC: 40.0000 mg | DELAYED_RELEASE_TABLET | Freq: Two times a day (BID) | ORAL | 3 refills | Status: DC

## 2022-06-14 MED ORDER — FLUTICASONE PROPIONATE HFA 220 MCG/ACT IN AERO: 2.0000 | INHALATION_SPRAY | Freq: Two times a day (BID) | RESPIRATORY_TRACT | 1 refills | Status: DC

## 2022-06-14 NOTE — Progress Notes (Unsigned)
EOE

## 2022-06-14 NOTE — Patient Instructions (Signed)
You will be receiving a call from the allergy clinic to evaluate and treat you for EOE.

## 2022-06-14 NOTE — Progress Notes (Unsigned)
Wyline Mood MD, MRCP(U.K) [address]  [address]  [place], [place] [address]  Main: [phone]  Fax: [phone]   Primary Care Physician: Corky Downs, MD  Primary Gastroenterologist:  Dr. Wyline Mood   Chief Complaint  Patient presents with   EOE    HPI: Mallory Jenkins is a 46 y.o. female   Summary of history :  She follows with me for eosinophilic esophagitis.  She transferred care to me in March 2023.  She was diagnosed with EOE in [place] 2 years back and came into the ER in December 2022 with a piece of chicken stuck in her esophagus had an EGD done by Dr. Mia Creek which showed LA grade D esophagitis biopsies esophagus were not taken food bolus was taken out and she was subsequently seen by me as an outpatient.  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  Suffers from seasonal allergies.  Interval history   01/26/2022-06/14/2022  05/13/2022: EGD: Biopsies of the esophagus were taken: Biopsies esophagus showed markedly increased eosinophils up to greater than 100 per high-power field.  Colonoscopy: No polyps 01/26/2022 Food allergy testing showed mild elevation of IgE to milk but nothing else She is here today to discuss results.  Presently taking Protonix 40 mg once a day.  She realizes that if she misses even a single dose she has symptoms of dysphagia otherwise is doing well no complaints.  She states that she has suffered from allergies her entire life mostly seasonal and she has to take a tablet of Zyrtec every day otherwise she can get hives all over her body.  Presently no issues with dysphagia.  Current Outpatient Medications  Medication Sig Dispense Refill   cetirizine (ZYRTEC) 10 MG tablet Take 10 mg by mouth daily as needed for allergies.     pantoprazole (PROTONIX) 40 MG tablet Take 1 tablet (40 mg total) by mouth in the morning and at bedtime. 60 tablet 1   No current facility-administered  medications for this visit.    Allergies as of 06/14/2022   (No Known Allergies)    ROS:  General: Negative for anorexia, weight loss, fever, chills, fatigue, weakness. ENT: Negative for hoarseness, difficulty swallowing , nasal congestion. CV: Negative for chest pain, angina, palpitations, dyspnea on exertion, peripheral edema.  Respiratory: Negative for dyspnea at rest, dyspnea on exertion, cough, sputum, wheezing.  GI: See history of present illness. GU:  Negative for dysuria, hematuria, urinary incontinence, urinary frequency, nocturnal urination.  Endo: Negative for unusual weight change.    Physical Examination:   BP 119/79   Pulse 87   Temp 98.7 F (37.1 C) (Oral)   Ht 5' (1.524 m)   Wt 120 lb 6.4 oz (54.6 kg)   BMI 23.51 kg/m   General: Well-nourished, well-developed in no acute distress.  Eyes: No icterus. Conjunctivae pink. Neuro: Alert and oriented x 3.  Grossly intact. Skin: Warm and dry, no jaundice.   Psych: Alert and cooperative, normal mood and affect.   Imaging Studies: No results found.  Assessment and Plan:   Mallory Jenkins is a 46 y.o. y/o female here to follow-up for eosinophilic esophagitis.  Had over 100 eosinophils per high-power field.  This was while on Protonix 40 mg once a day.  Explained to her the options of 8-week therapy of fluticasone inhaler that needs to be swallowed technique demonstrated.  After that we could either repeat  endoscopy to check  if there is a decrease in the eosinophil count or monitor clinically.  If we choose to perform an endoscopy and the eosinophil count is still elevated she may need to be treated with Dupixent.  Also give her the option to refer to allergy and immunology for more extensive allergy testing to determine if she is allergic to any food items which she can avoid.  At this point of time she would like to just take a course of fluticasone inhaler and see how it goes.  She is open to getting a referral for  allergy center for evaluation.  She will call me if her symptoms recur.    Dr Wyline Mood  MD,MRCP [facility]) Follow up in as needed

## 2022-06-15 ENCOUNTER — Other Ambulatory Visit: Payer: Self-pay

## 2022-06-15 ENCOUNTER — Encounter: Payer: Self-pay | Admitting: Gastroenterology

## 2022-06-15 MED ORDER — FLUTICASONE PROPIONATE HFA 220 MCG/ACT IN AERO
2.0000 | INHALATION_SPRAY | Freq: Two times a day (BID) | RESPIRATORY_TRACT | 1 refills | Status: DC
  Filled 2022-06-15: qty 12, 30d supply, fill #0

## 2022-06-15 MED ORDER — PANTOPRAZOLE SODIUM 40 MG PO TBEC
40.0000 mg | DELAYED_RELEASE_TABLET | Freq: Two times a day (BID) | ORAL | 3 refills | Status: DC
  Filled 2022-06-15: qty 180, 90d supply, fill #0
  Filled 2022-10-04: qty 180, 90d supply, fill #1
  Filled 2022-12-28: qty 180, 90d supply, fill #2
  Filled 2023-03-30: qty 180, 90d supply, fill #3

## 2022-06-15 NOTE — Addendum Note (Signed)
Addended by: Adela Ports on: 06/15/2022 10:44 AM   Modules accepted: Orders

## 2022-08-15 ENCOUNTER — Ambulatory Visit (INDEPENDENT_AMBULATORY_CARE_PROVIDER_SITE_OTHER): Payer: No Typology Code available for payment source | Admitting: Obstetrics & Gynecology

## 2022-08-15 ENCOUNTER — Encounter: Payer: Self-pay | Admitting: Obstetrics & Gynecology

## 2022-08-15 VITALS — BP 108/58 | HR 74 | Resp 16 | Wt 121.4 lb

## 2022-08-15 DIAGNOSIS — R635 Abnormal weight gain: Secondary | ICD-10-CM

## 2022-08-15 DIAGNOSIS — R5383 Other fatigue: Secondary | ICD-10-CM | POA: Diagnosis not present

## 2022-08-15 DIAGNOSIS — Z1231 Encounter for screening mammogram for malignant neoplasm of breast: Secondary | ICD-10-CM

## 2022-08-15 NOTE — Progress Notes (Signed)
Subjective:     Mallory Jenkins is a 46 y.o.   G2 P61  female here for a routine exam.  Current complaints: c/o feeling lethargic and gaining weight although she remains physically active. Reports 5-8 pound weight gain. Personal health questionnaire reviewed: yes.   Gynecologic History No LMP recorded. Patient has had a hysterectomy. Contraception: status post hysterectomy Last Pap: 07/2021. Results were: normal Last mammogram: 08-2021. Results were: normal  Obstetric History OB History  Gravida Para Term Preterm AB Living  2 2 2     2   SAB IAB Ectopic Multiple Live Births               # Outcome Date GA Lbr Len/2nd Weight Sex Delivery Anes PTL Lv  2 Term           1 Term              The following portions of the patient's history were reviewed and updated as appropriate: allergies, current medications, past family history, past medical history, past social history, past surgical history, and problem list.  Review of Systems A comprehensive review of systems was negative.    Objective:    BP (!) 108/58   Pulse 74   Resp 16   Wt 121 lb 6.4 oz (55.1 kg)   SpO2 99%   BMI 23.71 kg/m  General appearance: alert, cooperative, and no distress Breasts: normal appearance, no masses or tenderness Abdomen: soft, non-tender; bowel sounds normal; no masses,  no organomegaly Pelvic: cervix normal in appearance, external genitalia normal, no adnexal masses or tenderness, no cervical motion tenderness, uterus normal size, shape, and consistency, and vagina normal without discharge Extremities: extremities normal, atraumatic, no cyanosis or edema Skin: Skin color, texture, turgor normal. No rashes or lesions Neurologic: Grossly normal    Assessment:    Healthy female exam.   Lethargy, weight gain Plan:    Follow up in: 1 year. TSH, cbc. Fsh,

## 2022-08-16 LAB — CBC WITH DIFFERENTIAL/PLATELET
Basophils Absolute: 0.1 10*3/uL (ref 0.0–0.2)
Basos: 2 %
EOS (ABSOLUTE): 0.3 10*3/uL (ref 0.0–0.4)
Eos: 5 %
Hematocrit: 38 % (ref 34.0–46.6)
Hemoglobin: 13.2 g/dL (ref 11.1–15.9)
Immature Grans (Abs): 0 10*3/uL (ref 0.0–0.1)
Immature Granulocytes: 0 %
Lymphocytes Absolute: 2.5 10*3/uL (ref 0.7–3.1)
Lymphs: 41 %
MCH: 31.2 pg (ref 26.6–33.0)
MCHC: 34.7 g/dL (ref 31.5–35.7)
MCV: 90 fL (ref 79–97)
Monocytes Absolute: 0.4 10*3/uL (ref 0.1–0.9)
Monocytes: 6 %
Neutrophils Absolute: 2.8 10*3/uL (ref 1.4–7.0)
Neutrophils: 46 %
Platelets: 220 10*3/uL (ref 150–450)
RBC: 4.23 x10E6/uL (ref 3.77–5.28)
RDW: 11.8 % (ref 11.7–15.4)
WBC: 6.1 10*3/uL (ref 3.4–10.8)

## 2022-08-16 LAB — FOLLICLE STIMULATING HORMONE: FSH: 40.8 m[IU]/mL

## 2022-08-16 LAB — TSH: TSH: 1.17 u[IU]/mL (ref 0.450–4.500)

## 2022-10-04 ENCOUNTER — Ambulatory Visit
Admission: RE | Admit: 2022-10-04 | Discharge: 2022-10-04 | Disposition: A | Discharge status: Home / Self Care | Payer: No Typology Code available for payment source | Source: Ambulatory Visit | Attending: Obstetrics & Gynecology | Admitting: Obstetrics & Gynecology

## 2022-10-04 ENCOUNTER — Other Ambulatory Visit: Payer: Self-pay

## 2022-10-04 DIAGNOSIS — Z1231 Encounter for screening mammogram for malignant neoplasm of breast: Secondary | ICD-10-CM | POA: Diagnosis present

## 2023-02-07 DIAGNOSIS — H43392 Other vitreous opacities, left eye: Secondary | ICD-10-CM | POA: Diagnosis not present

## 2023-02-07 DIAGNOSIS — H524 Presbyopia: Secondary | ICD-10-CM | POA: Diagnosis not present

## 2023-02-07 DIAGNOSIS — H5213 Myopia, bilateral: Secondary | ICD-10-CM | POA: Diagnosis not present

## 2023-02-21 IMAGING — MG MM DIGITAL SCREENING BILAT W/ TOMO AND CAD
8 series · 9 of 24 positions shown · non-contrast
Comparison: Previous exam(s).

CLINICAL DATA: Screening.

EXAM:
DIGITAL SCREENING BILATERAL MAMMOGRAM WITH TOMOSYNTHESIS AND CAD
TECHNIQUE: Bilateral screening digital craniocaudal and mediolateral oblique
mammograms were obtained. Bilateral screening digital breast
tomosynthesis was performed. The images were evaluated with
computer-aided detection.

[R CC synth-2D]
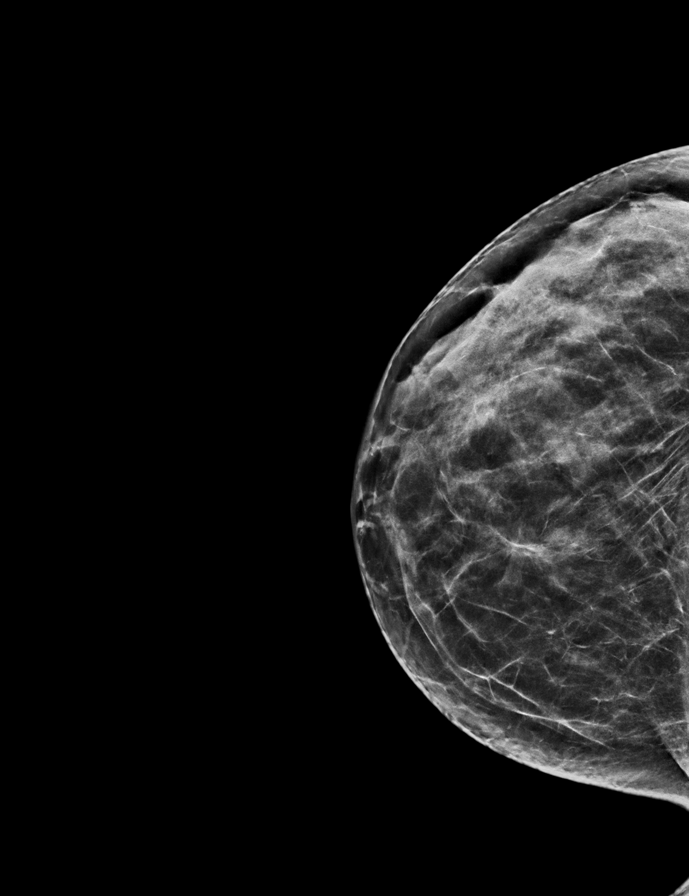

[L CC synth-2D]
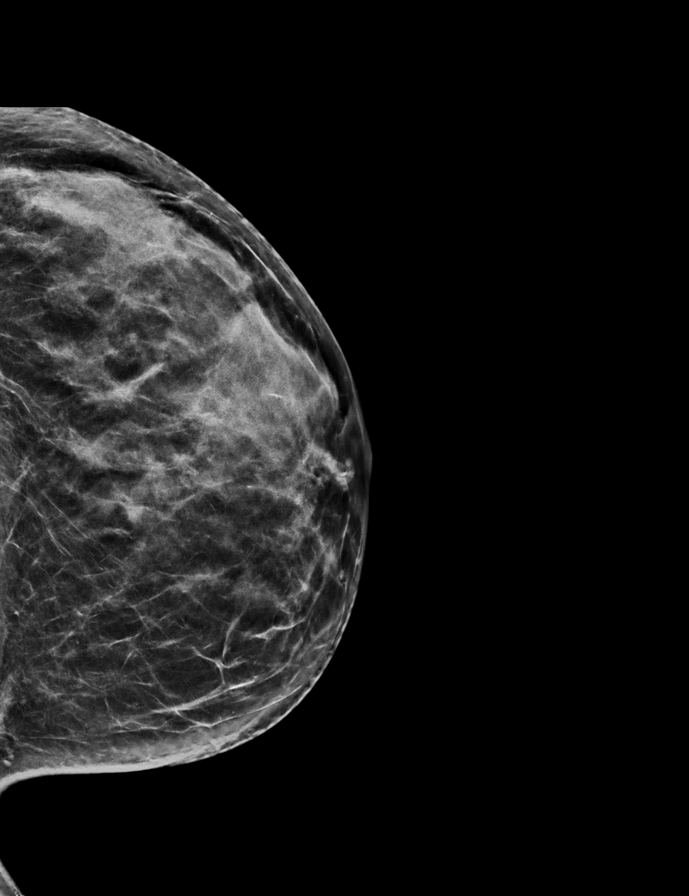

[R MLO synth-2D]
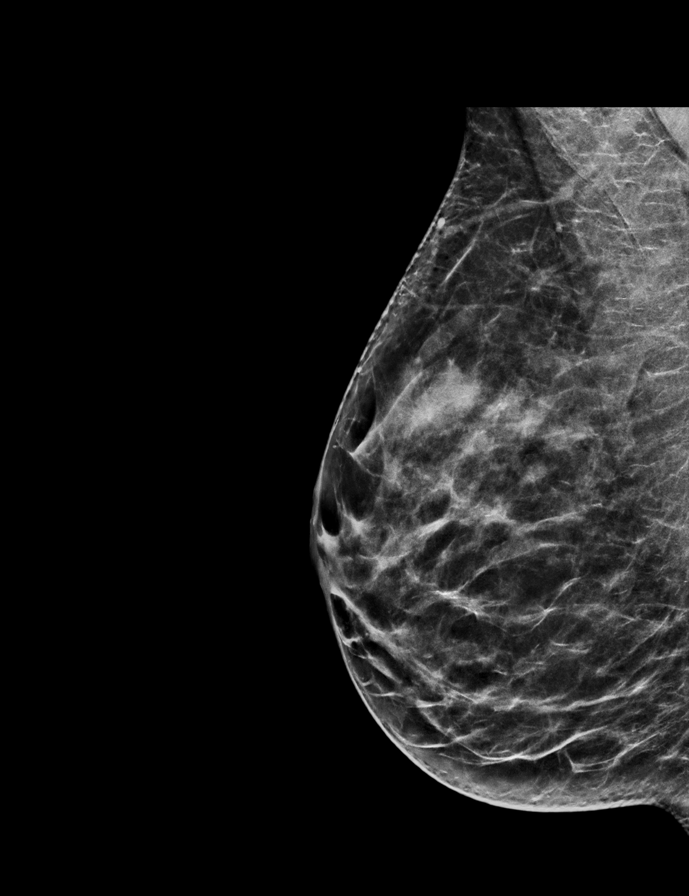

[L MLO synth-2D]
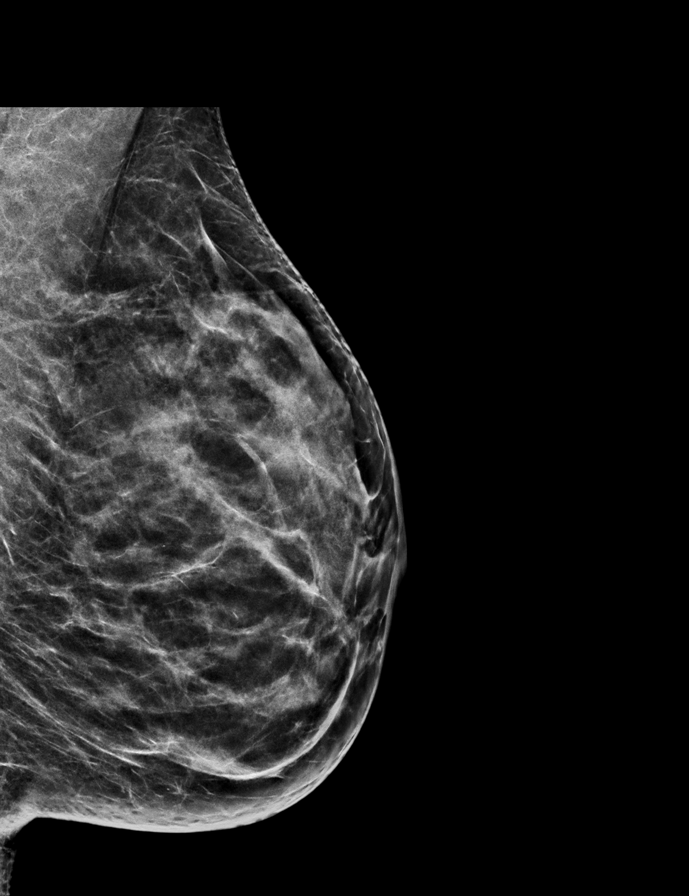

[R CC tomo · 2 of 57 frames shown]
[frame 19/57]
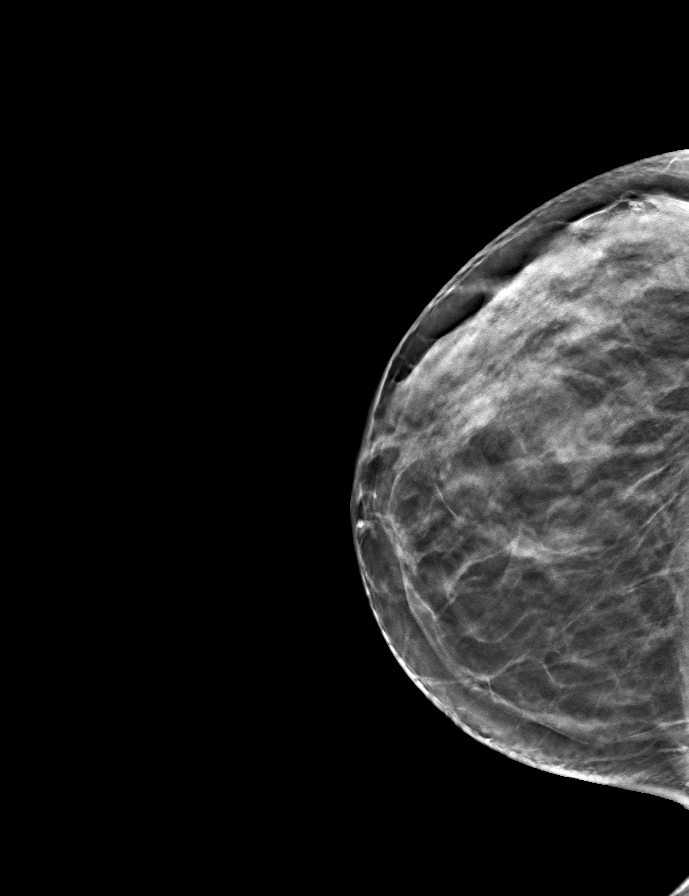
[frame 29/57]
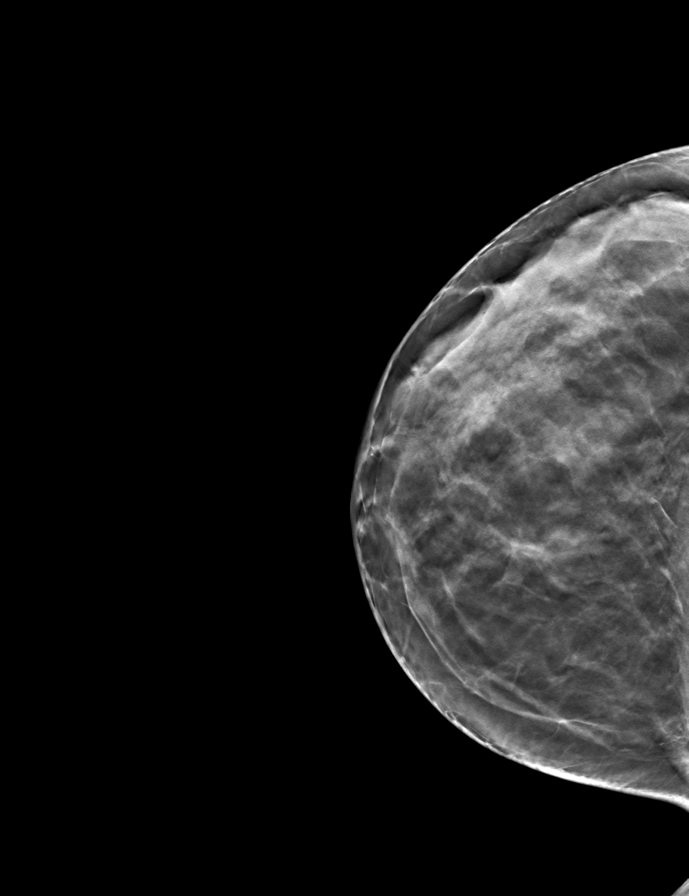

[R MLO tomo · tomo slice 30/59.0]
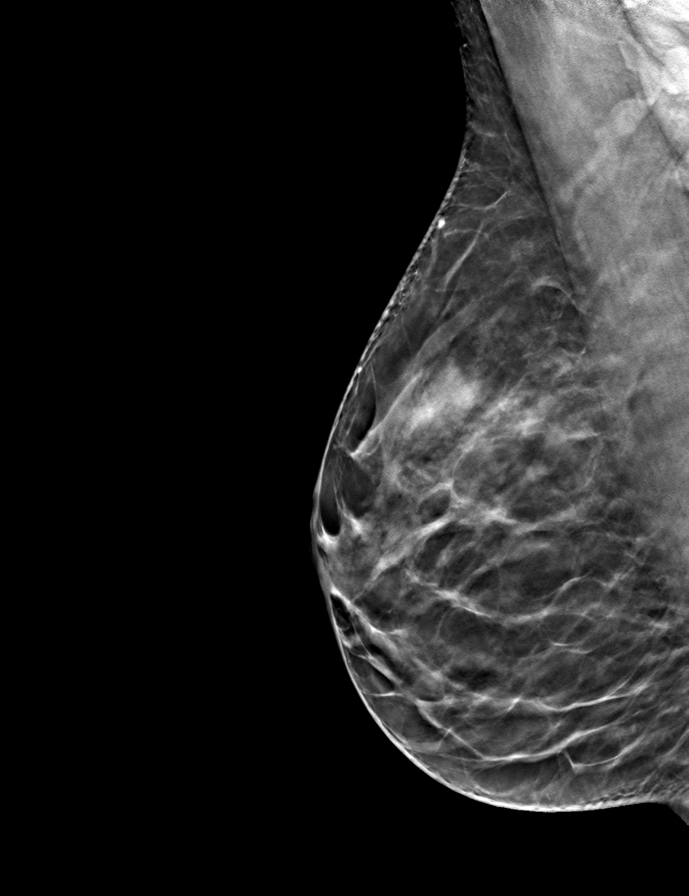

[L CC tomo · tomo slice 29/57.0]
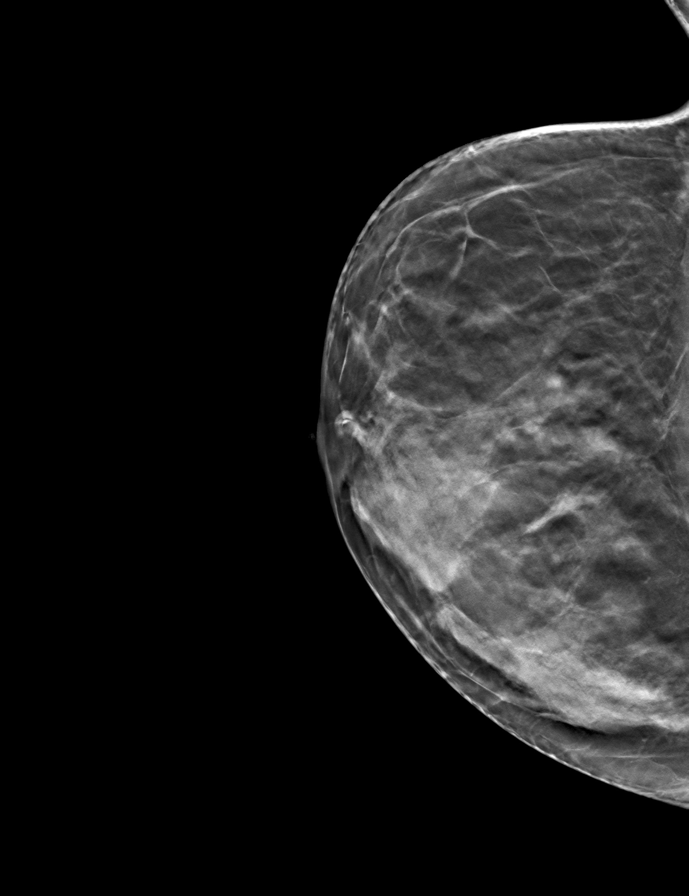

[L MLO tomo · tomo slice 29/56.0]
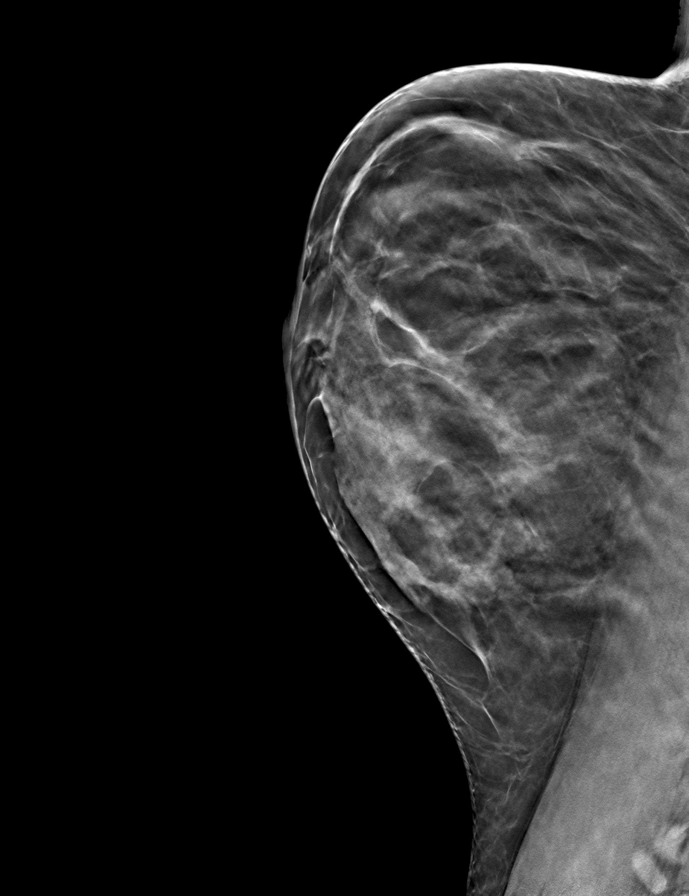

[9 of 24 positions shown; findings below may reference images not displayed]

ACR Breast Density Category c: The breast tissue is heterogeneously
dense, which may obscure small masses.
FINDINGS: There are no findings suspicious for malignancy.
IMPRESSION: No mammographic evidence of malignancy. A result letter of this
screening mammogram will be mailed directly to the patient.

RECOMMENDATION:
Screening mammogram in one year. (Code:Q3-W-BC3)

BI-RADS CATEGORY  1: Negative.

## 2023-03-02 NOTE — Progress Notes (Signed)
Bethanie Dicker, NP-C Phone: [phone]  Mallory Jenkins is a 47 y.o. female who presents today to establish care and for annual exam. She has no complaints or new concerns today.   Esophagitis- Denies dysphagia. Currently on Protonix 40 mg twice daily. Followed by GI. Denies abdominal pains. Denies blood in stool. Recent endoscopy in June 2023. Reports occasional heartburn if misses dose of medication.  Diet: Mostly vegetables/salads and protein, no red meat, rarely eats pasta Exercise: Swims a lot, walks daily, competes in triathlons, very active Pap smear: 07/22/2021 Colonoscopy: 05/13/2022- 10 year recall Mammogram: 10/04/2022 Family history-  Colon cancer: No  Breast cancer: No  Ovarian cancer: No Menses: Hysterectomy Sexually active: Yes Vaccines-   Flu: Declined  Tetanus: UTD  COVID19: x 1 HIV screening: Deferred Hep C Screening: Deferred Tobacco use: No Alcohol use: Once a month Illicit Drug use: No Dentist: Yes Ophthalmology: Yes   Active Ambulatory Problems    Diagnosis Date Noted   Preventative health care 03/03/2023   Eosinophilic esophagitis 03/03/2023   Resolved Ambulatory Problems    Diagnosis Date Noted   No Resolved Ambulatory Problems   No Additional Past Medical History    History reviewed. No pertinent family history.  Social History   Socioeconomic History   Marital status: Married    Spouse name: Not on file   Number of children: Not on file   Years of education: Not on file   Highest education level: Not on file  Occupational History   Not on file  Tobacco Use   Smoking status: Never   Smokeless tobacco: Never  Vaping Use   Vaping Use: Never used  Substance and Sexual Activity   Alcohol use: Never   Drug use: Never   Sexual activity: Yes  Other Topics Concern   Not on file  Social History Narrative   Not on file   Social Determinants of Health   Financial Resource Strain: Not on file  Food Insecurity: Not on file   Transportation Needs: Not on file  Physical Activity: Not on file  Stress: Not on file  Social Connections: Not on file  Intimate Partner Violence: Not on file    ROS  General:  Negative for unexplained weight loss, fever Skin: Negative for new or changing mole, sore that won't heal HEENT: Negative for trouble hearing, trouble seeing, ringing in ears, mouth sores, hoarseness, change in voice, dysphagia. CV:  Negative for chest pain, dyspnea, edema, palpitations Resp: Negative for cough, dyspnea, hemoptysis GI: Negative for nausea, vomiting, diarrhea, constipation, abdominal pain, melena, hematochezia. GU: Negative for dysuria, incontinence, urinary hesitance, hematuria, vaginal or penile discharge, polyuria, sexual difficulty, lumps in testicle or breasts MSK: Negative for muscle cramps or aches, joint pain or swelling Neuro: Negative for headaches, weakness, numbness, dizziness, passing out/fainting Psych: Negative for depression, anxiety, memory problems  Objective  Physical Exam Vitals:   03/03/23 1300  BP: 100/70  Pulse: 80  Temp: 98.2 F (36.8 C)  SpO2: 97%    BP Readings from Last 3 Encounters:  03/03/23 100/70  08/15/22 (!) 108/58  06/14/22 119/79   Wt Readings from Last 3 Encounters:  03/03/23 124 lb 6.4 oz (56.4 kg)  08/15/22 121 lb 6.4 oz (55.1 kg)  06/14/22 120 lb 6.4 oz (54.6 kg)    Physical Exam Constitutional:      General: She is not in acute distress.    Appearance: Normal appearance.  HENT:     Head: Normocephalic.     Right Ear: Tympanic  membrane normal.     Left Ear: Tympanic membrane normal.     Nose: Nose normal.     Mouth/Throat:     Mouth: Mucous membranes are moist.     Pharynx: Oropharynx is clear.  Eyes:     Conjunctiva/sclera: Conjunctivae normal.     Pupils: Pupils are equal, round, and reactive to light.  Neck:     Thyroid: No thyromegaly.  Cardiovascular:     Rate and Rhythm: Normal rate and regular rhythm.     Heart  sounds: Normal heart sounds.  Pulmonary:     Effort: Pulmonary effort is normal.     Breath sounds: Normal breath sounds.  Abdominal:     General: Abdomen is flat. Bowel sounds are normal.     Palpations: Abdomen is soft. There is no mass.     Tenderness: There is no abdominal tenderness.  Musculoskeletal:        General: Normal range of motion.  Lymphadenopathy:     Cervical: No cervical adenopathy.  Skin:    General: Skin is warm and dry.     Findings: No rash.  Neurological:     General: No focal deficit present.     Mental Status: She is alert.  Psychiatric:        Mood and Affect: Mood normal.        Behavior: Behavior normal.    Assessment/Plan:   Preventative health care Assessment & Plan: Physical exam complete. Patient declined lab work today. Will plan to do at next appointment. Pap- UTD. Colonoscopy- UTD. Mammo- UTD. Declined flu and additional COVID vaccines. Tetanus vaccine- UTD. Deferred HIV/Hep C screenings. Recommended annual follow ups with Dentist and Ophthalmology. Encouraged to continue healthy diet and exercise. Follow up in one year for annual exam or sooner PRN.    Eosinophilic esophagitis Assessment & Plan: Chronic. Stable on Protonix 40 mg BID. Continue. Follow up with GI as needed. Will monitor for worsening symptoms.     Return in about 1 year (around 03/02/2024) for Annual Exam.   Bethanie Dicker, NP-C [facility] [employer]

## 2023-03-03 ENCOUNTER — Ambulatory Visit (INDEPENDENT_AMBULATORY_CARE_PROVIDER_SITE_OTHER): Payer: 59 | Admitting: Nurse Practitioner

## 2023-03-03 ENCOUNTER — Encounter: Payer: Self-pay | Admitting: Nurse Practitioner

## 2023-03-03 VITALS — BP 100/70 | HR 80 | Temp 98.2°F | Ht 61.0 in | Wt 124.4 lb

## 2023-03-03 DIAGNOSIS — K200 Eosinophilic esophagitis: Secondary | ICD-10-CM | POA: Diagnosis not present

## 2023-03-03 DIAGNOSIS — Z0000 Encounter for general adult medical examination without abnormal findings: Secondary | ICD-10-CM | POA: Diagnosis not present

## 2023-03-03 NOTE — Assessment & Plan Note (Signed)
Chronic. Stable on Protonix 40 mg BID. Continue. Follow up with GI as needed. Will monitor for worsening symptoms.

## 2023-03-03 NOTE — Assessment & Plan Note (Signed)
Physical exam complete. Patient declined lab work today. Will plan to do at next appointment. Pap- UTD. Colonoscopy- UTD. Mammo- UTD. Declined flu and additional COVID vaccines. Tetanus vaccine- UTD. Deferred HIV/Hep C screenings. Recommended annual follow ups with Dentist and Ophthalmology. Encouraged to continue healthy diet and exercise. Follow up in one year for annual exam or sooner PRN.

## 2023-03-30 ENCOUNTER — Other Ambulatory Visit: Payer: Self-pay

## 2023-03-31 ENCOUNTER — Ambulatory Visit: Payer: 59 | Admitting: Podiatry

## 2023-03-31 ENCOUNTER — Encounter: Payer: Self-pay | Admitting: Podiatry

## 2023-03-31 ENCOUNTER — Ambulatory Visit (INDEPENDENT_AMBULATORY_CARE_PROVIDER_SITE_OTHER): Payer: 59

## 2023-03-31 VITALS — BP 115/75 | HR 76

## 2023-03-31 DIAGNOSIS — M2011 Hallux valgus (acquired), right foot: Secondary | ICD-10-CM | POA: Diagnosis not present

## 2023-03-31 NOTE — Progress Notes (Signed)
Chief Complaint  Patient presents with   Foot Pain    "I have an enlarged bunion." N - enlarged bunion L - right  D - 5 yrs O - gradually worse C - throb, ache, tender, sharp pains A - standing and walking, exercise T - none    Subjective: 47 y.o. female presents today for evaluation of symptomatic bunion that has been progressively getting worse over the past 5 years.  Patient has tried conservative treatment modalities including wide shoes and shoe gear modifications to alleviate pain and pressure from the bunion.  She says that it is slowly and gradually getting worse and she experiences throbbing and aching sensations especially during activity.  Presents today for further treatment evaluation and to discuss surgery  No past medical history on file.  Past Surgical History:  Procedure Laterality Date   ABDOMINAL HYSTERECTOMY     COLONOSCOPY WITH PROPOFOL N/A 05/13/2022   Procedure: COLONOSCOPY WITH PROPOFOL;  Surgeon: Wyline Mood, MD;  [facility] ENDOSCOPY;  Service: Gastroenterology;  Laterality: N/A;   ESOPHAGOGASTRODUODENOSCOPY N/A 05/13/2022   Procedure: ESOPHAGOGASTRODUODENOSCOPY (EGD);  Surgeon: Wyline Mood, MD;  [facility] ENDOSCOPY;  Service: Gastroenterology;  Laterality: N/A;   ESOPHAGOGASTRODUODENOSCOPY (EGD) WITH PROPOFOL N/A 11/17/2021   Procedure: ESOPHAGOGASTRODUODENOSCOPY (EGD) WITH PROPOFOL;  Surgeon: Regis Bill, MD;  Location: [facility] ENDOSCOPY;  Service: Endoscopy;  Laterality: N/A;    No Known Allergies   Objective: Physical Exam General: The patient is alert and oriented x3 in no acute distress.  Dermatology: Skin is cool, dry and supple bilateral lower extremities. Negative for open lesions or macerations.  Vascular: Palpable pedal pulses bilaterally. No edema or erythema noted. Capillary refill within normal limits.  Neurological: Epicritic and protective threshold grossly intact bilaterally.   Musculoskeletal Exam: Clinical evidence  of bunion deformity noted to the respective foot. There is moderate pain on palpation range of motion of the first MPJ. Lateral deviation of the hallux noted consistent with hallux abductovalgus.  Radiographic Exam RT foot 03/31/2023: Normal osseous mineralization.  No acute fractures identified.  Increased intermetatarsal angle greater than 15 with a hallux abductus angle greater than 30 noted on AP view.  Findings consistent with hallux valgus deformity  Assessment: 1.  Hallux valgus right  -Patient evaluated.  X-rays reviewed -Today we discussed both surgical and conservative management of the bunion deformity.  Unfortunately patient continues to have pain and tenderness on a daily basis despite shoe gear modifications.  She would like to pursue surgery at this time.  I do believe that it is warranted since it has progressively gotten worse over the last 5 years -I do believe the patient would benefit from Lapidus type bunionectomy.  Risk benefits advantages and disadvantages as well as the postoperative recovery course were explained in detail to the patient.  Patient understands that she will be roughly 6 weeks in the cam boot with minimal WBAT for the first 2 weeks.  Recommend 2 weeks off of work.  Patient works at the cancer center for [employer] and states that she will be able to reduce her activity at work. -Authorization for surgery was initiated today.  Surgery will consist of Lapidus bunionectomy right ([id number] requested) -Return to clinic 1 week postop  *works at [employer] [facility] [employer] with Janalyn Rouse, DPM [facility]  Dr. Felecia Shelling, DPM    [address]  [place], [place] [address]                Office [phone]  Fax [phone]

## 2023-05-17 IMAGING — CR DG NECK SOFT TISSUE
2 series · 2 of 2 positions shown · non-contrast
Comparison: None.

CLINICAL DATA: Possible foreign body

EXAM:
NECK SOFT TISSUES - 1+ VIEW

[neck lat]
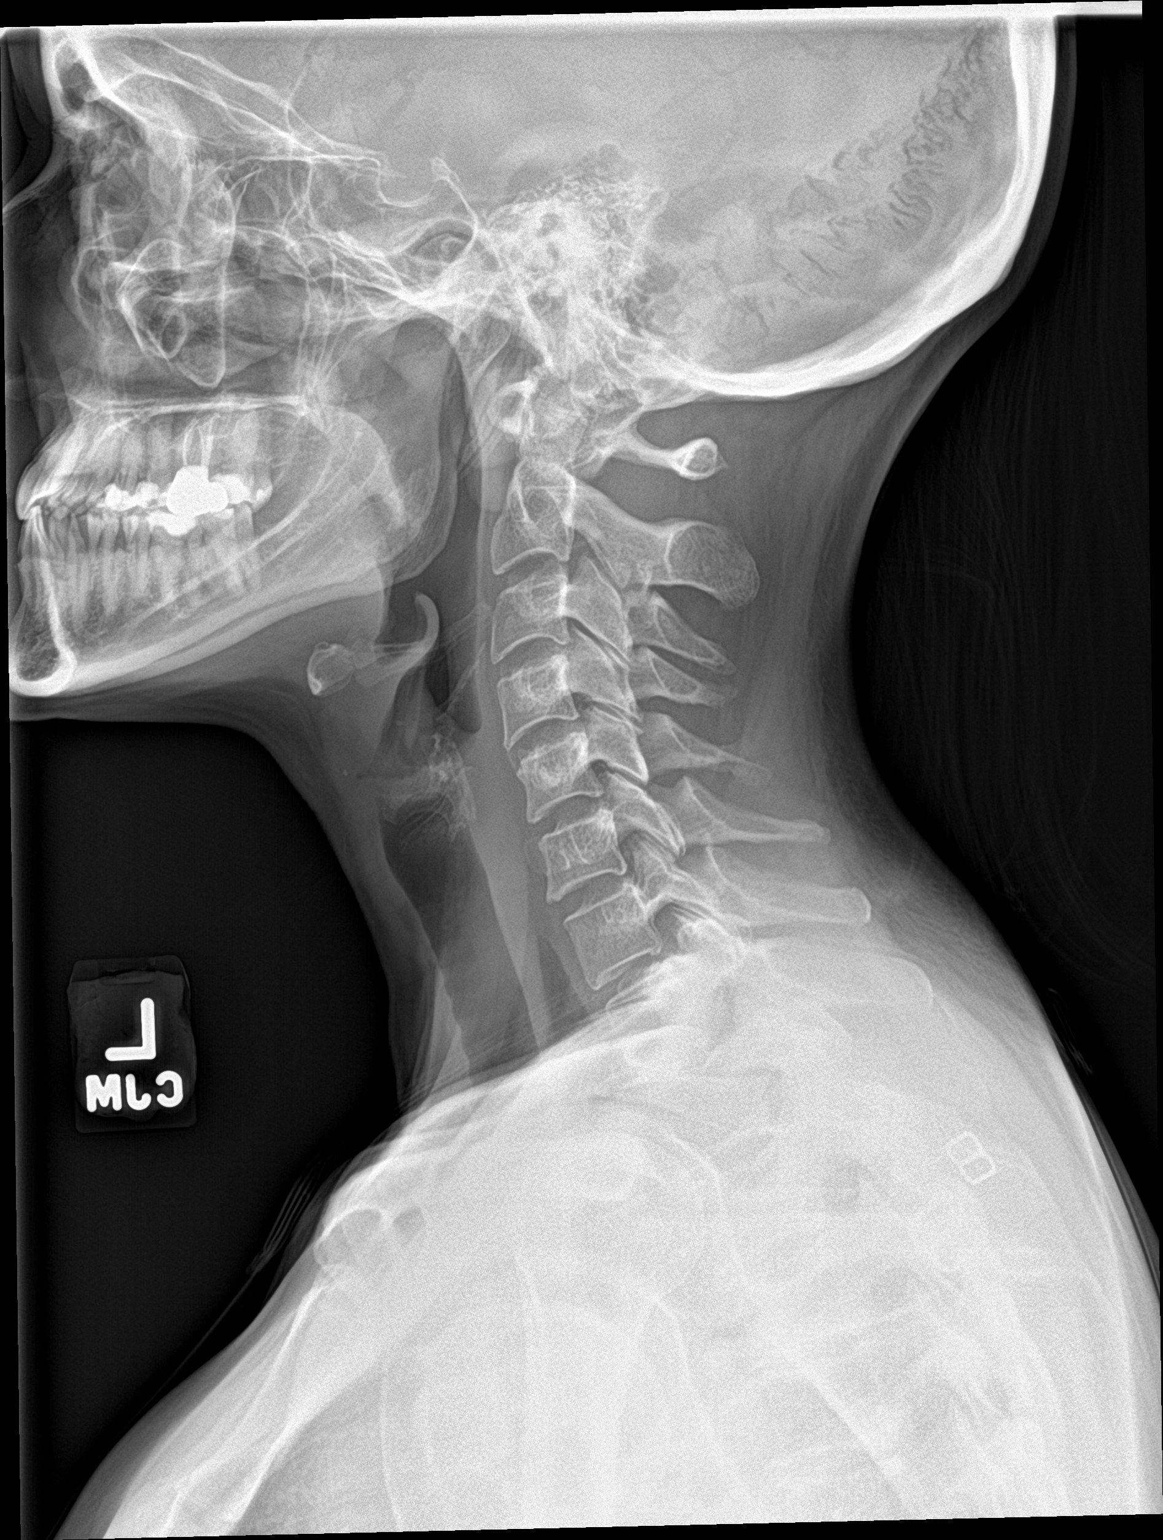

[neck ap]
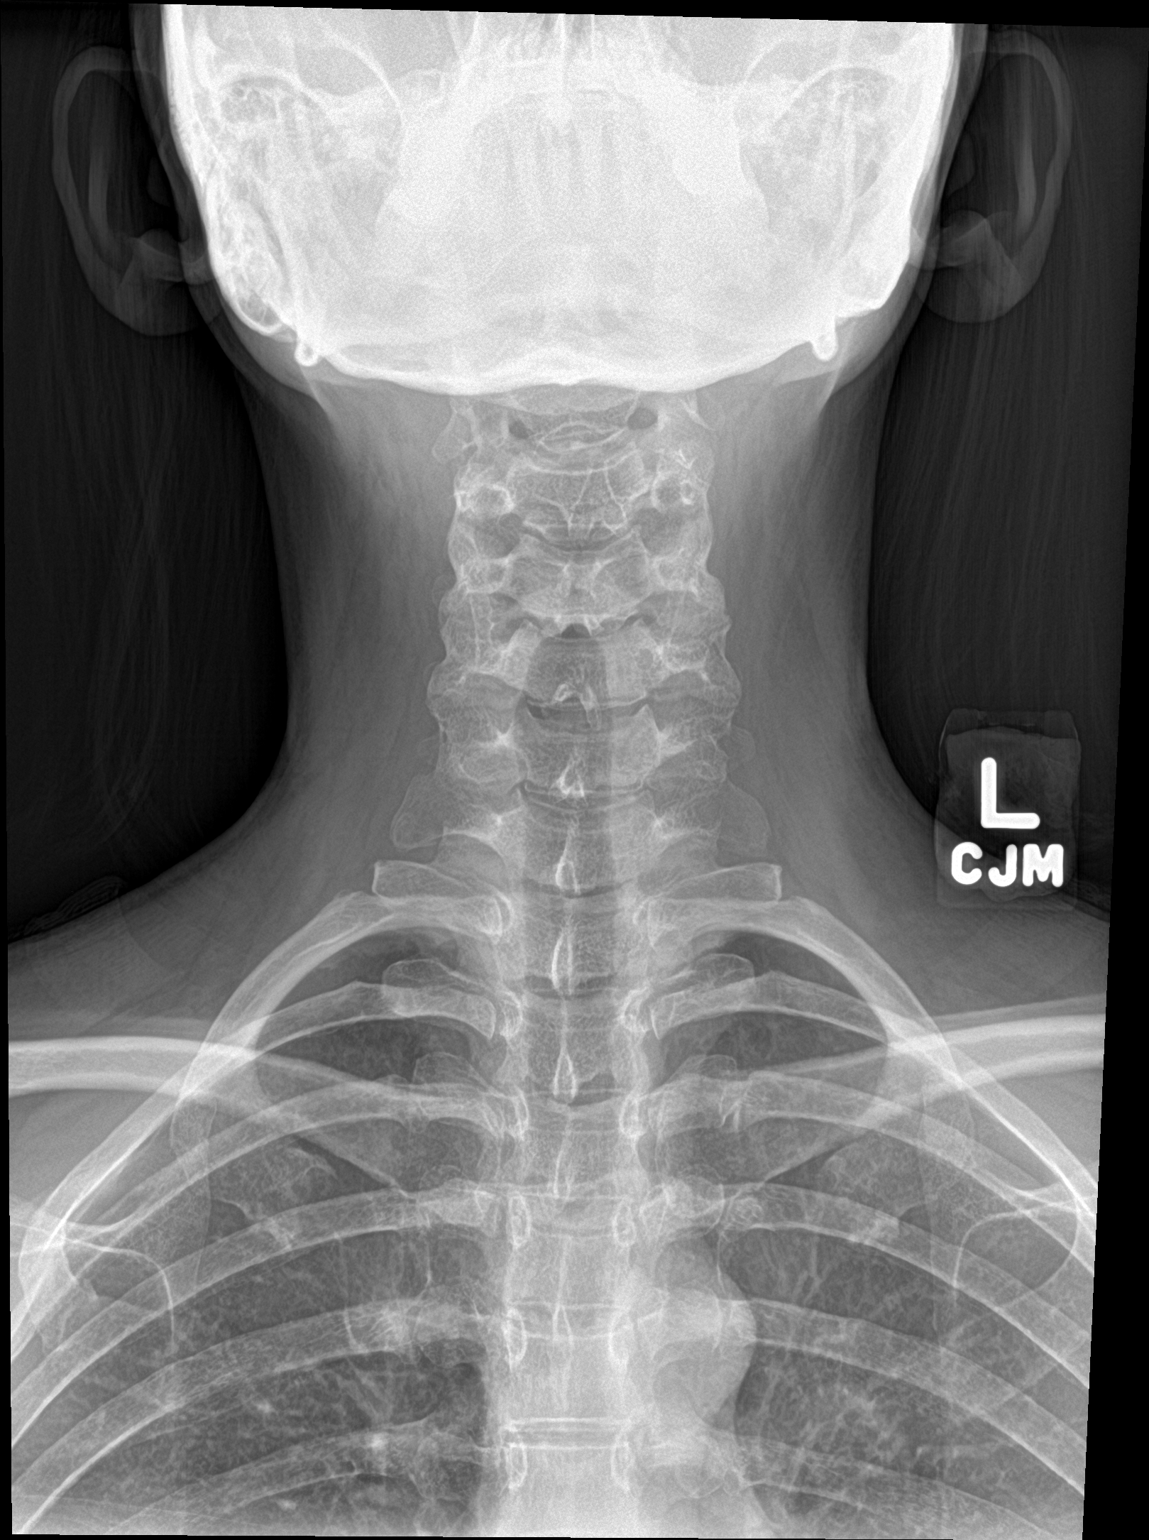

[2 of 2 positions shown; findings below may reference images not displayed]

FINDINGS: There is no evidence of retropharyngeal soft tissue swelling or
epiglottic enlargement. The cervical airway is unremarkable and no
radio-opaque foreign body identified.
IMPRESSION: No radiopaque foreign body identified.

## 2023-05-18 ENCOUNTER — Telehealth: Payer: Self-pay | Admitting: Urology

## 2023-05-18 NOTE — Telephone Encounter (Signed)
DOS - 06/15/23  LAPIDUS PROCEDURE INCLUDING BUNIONECTOMY RIGHT --- [id number]  AETNA EFFECTIVE DATE - 11/21/22  PER AETNA'S AUTOMATED SYSTEM FOR CPT CODE 62130 NO PRIOR AUTH IS REQUIRED.  CALL REF # [id number]

## 2023-06-14 ENCOUNTER — Encounter: Payer: Self-pay | Admitting: Podiatry

## 2023-06-15 ENCOUNTER — Other Ambulatory Visit: Payer: Self-pay | Admitting: Podiatry

## 2023-06-15 DIAGNOSIS — G8918 Other acute postprocedural pain: Secondary | ICD-10-CM | POA: Diagnosis not present

## 2023-06-15 DIAGNOSIS — M2011 Hallux valgus (acquired), right foot: Secondary | ICD-10-CM | POA: Diagnosis not present

## 2023-06-15 DIAGNOSIS — M21611 Bunion of right foot: Secondary | ICD-10-CM | POA: Diagnosis not present

## 2023-06-15 MED ORDER — IBUPROFEN 800 MG PO TABS
800.0000 mg | ORAL_TABLET | Freq: Three times a day (TID) | ORAL | 0 refills | Status: DC
  Filled 2023-07-10 – 2023-07-11 (×2): qty 60, 20d supply, fill #0

## 2023-06-15 MED ORDER — OXYCODONE-ACETAMINOPHEN 5-325 MG PO TABS: 1.0000 | ORAL_TABLET | ORAL | 0 refills | Status: DC | PRN

## 2023-06-15 NOTE — Progress Notes (Signed)
PRN postop 

## 2023-06-22 ENCOUNTER — Ambulatory Visit: Payer: 59

## 2023-06-22 ENCOUNTER — Ambulatory Visit (INDEPENDENT_AMBULATORY_CARE_PROVIDER_SITE_OTHER): Payer: 59 | Admitting: Podiatry

## 2023-06-22 DIAGNOSIS — M2011 Hallux valgus (acquired), right foot: Secondary | ICD-10-CM

## 2023-06-22 DIAGNOSIS — Z9889 Other specified postprocedural states: Secondary | ICD-10-CM

## 2023-06-22 NOTE — Progress Notes (Signed)
  Subjective:  Patient ID: Mallory Jenkins, female    DOB: [birth year],  MRN: [id number]  Chief Complaint  Patient presents with   Routine Post Op    POV #1 DOS 06/15/2023 LAPIDUS BUNIONECOTMY RT    DOS: 06/15/2023 Procedure: Right Lapidus bunionectomy  47 y.o. female returns for post-op check.  Patient states that she is doing well minimal pain.  Denies any other acute complaints.  Review of Systems: Negative except as noted in the HPI. Denies N/V/F/Ch.  No past medical history on file.  Current Outpatient Medications:    cetirizine (ZYRTEC) 10 MG tablet, Take 10 mg by mouth daily as needed for allergies., Disp: , Rfl:    ibuprofen (ADVIL) 800 MG tablet, Take 1 tablet (800 mg total) by mouth 3 (three) times daily., Disp: 60 tablet, Rfl: 1   oxyCODONE-acetaminophen (PERCOCET) 5-325 MG tablet, Take 1 tablet by mouth every 4 (four) hours as needed for severe pain., Disp: 30 tablet, Rfl: 0   pantoprazole (PROTONIX) 40 MG tablet, Take 1 tablet (40 mg total) by mouth in the morning and at bedtime., Disp: 180 tablet, Rfl: 3  Social History   Tobacco Use  Smoking Status Never  Smokeless Tobacco Never    No Known Allergies Objective:  There were no vitals filed for this visit. There is no height or weight on file to calculate BMI. Constitutional Well developed. Well nourished.  Vascular Foot warm and well perfused. Capillary refill normal to all digits.   Neurologic Normal speech. Oriented to person, place, and time. Epicritic sensation to light touch grossly present bilaterally.  Dermatologic Skin healing well without signs of infection. Skin edges well coapted without signs of infection.  Orthopedic: Tenderness to palpation noted about the surgical site.   Radiographs: 3 views of scheduleTotal right foot: Good correction alignment noted hardware is in tact no signs of backing out or loosening noted. Assessment:   1. Hallux abducto valgus, right   2. Status post foot surgery     Plan:  Patient was evaluated and treated and all questions answered.  S/p foot surgery right -Progressing as expected post-operatively. -XR: See above -WB Status: Nonweightbearing in right extremity with crutches -Sutures: Intact.  No clinical signs of dehiscence noted.  No complication noted. -Medications: None -Foot redressed.  No follow-ups on file.

## 2023-06-24 ENCOUNTER — Other Ambulatory Visit: Payer: Self-pay | Admitting: Gastroenterology

## 2023-06-25 ENCOUNTER — Other Ambulatory Visit: Payer: Self-pay | Admitting: Gastroenterology

## 2023-06-25 ENCOUNTER — Other Ambulatory Visit: Payer: Self-pay

## 2023-06-26 ENCOUNTER — Other Ambulatory Visit: Payer: Self-pay

## 2023-06-26 MED FILL — Pantoprazole Sodium EC Tab 40 MG (Base Equiv): ORAL | 90 days supply | Qty: 180 | Fill #0 | Status: AC

## 2023-06-30 ENCOUNTER — Ambulatory Visit (INDEPENDENT_AMBULATORY_CARE_PROVIDER_SITE_OTHER): Payer: 59 | Admitting: Podiatry

## 2023-06-30 ENCOUNTER — Other Ambulatory Visit: Payer: Self-pay

## 2023-06-30 ENCOUNTER — Encounter: Payer: 59 | Admitting: Podiatry

## 2023-06-30 ENCOUNTER — Encounter: Payer: Self-pay | Admitting: Podiatry

## 2023-06-30 DIAGNOSIS — M2011 Hallux valgus (acquired), right foot: Secondary | ICD-10-CM

## 2023-06-30 MED ORDER — DOXYCYCLINE HYCLATE 100 MG PO TABS
100.0000 mg | ORAL_TABLET | Freq: Two times a day (BID) | ORAL | 0 refills | Status: DC
  Filled 2023-06-30: qty 20, 10d supply, fill #0

## 2023-07-07 ENCOUNTER — Ambulatory Visit (INDEPENDENT_AMBULATORY_CARE_PROVIDER_SITE_OTHER): Payer: 59

## 2023-07-07 ENCOUNTER — Ambulatory Visit (INDEPENDENT_AMBULATORY_CARE_PROVIDER_SITE_OTHER): Payer: 59 | Admitting: Podiatry

## 2023-07-07 ENCOUNTER — Other Ambulatory Visit: Payer: Self-pay

## 2023-07-07 DIAGNOSIS — M2011 Hallux valgus (acquired), right foot: Secondary | ICD-10-CM | POA: Diagnosis not present

## 2023-07-07 MED ORDER — DOXYCYCLINE HYCLATE 100 MG PO TABS
100.0000 mg | ORAL_TABLET | Freq: Two times a day (BID) | ORAL | 0 refills | Status: DC
  Filled 2023-07-07: qty 20, 10d supply, fill #0

## 2023-07-07 NOTE — Progress Notes (Signed)
   Chief Complaint  Patient presents with   Routine Post Op     Routine Post Op      POV #1 DOS 06/15/2023 LAPIDUS BUNIONECOTMY RT       Subjective:  Patient presents today status post Lapidus bunionectomy right foot.  DOS: 06/15/2023.  Patient has been WBAT in the cam boot with the assistance of crutches.  She has noticed some redness around the incision with slight fluctuance.  No drainage however.  No past medical history on file.  Past Surgical History:  Procedure Laterality Date   ABDOMINAL HYSTERECTOMY     COLONOSCOPY WITH PROPOFOL N/A 05/13/2022   Procedure: COLONOSCOPY WITH PROPOFOL;  Surgeon: Wyline Mood, MD;  Location: [facility] ENDOSCOPY;  Service: Gastroenterology;  Laterality: N/A;   ESOPHAGOGASTRODUODENOSCOPY N/A 05/13/2022   Procedure: ESOPHAGOGASTRODUODENOSCOPY (EGD);  Surgeon: Wyline Mood, MD;  Location: [facility] ENDOSCOPY;  Service: Gastroenterology;  Laterality: N/A;   ESOPHAGOGASTRODUODENOSCOPY (EGD) WITH PROPOFOL N/A 11/17/2021   Procedure: ESOPHAGOGASTRODUODENOSCOPY (EGD) WITH PROPOFOL;  Surgeon: Regis Bill, MD;  Location: [facility] ENDOSCOPY;  Service: Endoscopy;  Laterality: N/A;    No Known Allergies   RT foot 07/07/2023  Objective/Physical Exam Neurovascular status intact.  Incision site continues to be well coapted.  No drainage.  There is some very slight fluctuance around the incision site area with mild localized erythema.  Sutures are intact.  Please see above noted photo  Radiographic Exam RT foot 07/07/2023:  Orthopedic hardware and osteotomies sites appear to be stable with routine healing.  Good alignment of the first ray.  Assessment: 1. s/p Lapidus bunionectomy right. DOS: 06/15/2023   Plan of Care:  -Patient was evaluated. X-rays reviewed - Sutures removed -Debridement of the proximal incision site was performed today using a tissue nipper.  Unfortunately the soft tissue and skin at the incision site appears to be nonviable.  This will likely  create a postsurgical wound and wound care will be required along the incision site.  Patient understands.  Pressure was applied around the area of the fluctuance but there was no drainage.  Currently am not concerned for abscess. -Due to the continued erythema I did represcribe doxycycline 100 mg 2 times daily #20.  Patient tolerating this well -Recommend Betadine with a light dressing daily and compression Ace wrap.  Supplies provided -Continue WBAT surgical shoe -Patient okay to return to work: Return to working [employer].  Sedentary work only, rest as needed, no extended walking or standing -Return to clinic 1 week  *works at [employer] with Janalyn Rouse, DPM [facility]  Dr. Felecia Shelling, DPM    [address] [place], [place] [address]                Office [phone]  Fax [phone]

## 2023-07-10 ENCOUNTER — Other Ambulatory Visit: Payer: Self-pay

## 2023-07-10 NOTE — Progress Notes (Signed)
   Chief Complaint  Patient presents with   Routine Post Op    "It's doing.  I've done good."    Subjective:   Patient presents today status post Lapidus bunionectomy of the right foot.  DOS: 06/15/2023.  Patient doing well.  She has been minimal weightbearing in the cam boot.  No new complaints  No past medical history on file.  Past Surgical History:  Procedure Laterality Date   ABDOMINAL HYSTERECTOMY     COLONOSCOPY WITH PROPOFOL N/A 05/13/2022   Procedure: COLONOSCOPY WITH PROPOFOL;  Surgeon: Wyline Mood, MD;  Location: [facility] ENDOSCOPY;  Service: Gastroenterology;  Laterality: N/A;   ESOPHAGOGASTRODUODENOSCOPY N/A 05/13/2022   Procedure: ESOPHAGOGASTRODUODENOSCOPY (EGD);  Surgeon: Wyline Mood, MD;  Location: [facility] ENDOSCOPY;  Service: Gastroenterology;  Laterality: N/A;   ESOPHAGOGASTRODUODENOSCOPY (EGD) WITH PROPOFOL N/A 11/17/2021   Procedure: ESOPHAGOGASTRODUODENOSCOPY (EGD) WITH PROPOFOL;  Surgeon: Regis Bill, MD;  Location: [facility] ENDOSCOPY;  Service: Endoscopy;  Laterality: N/A;    No Known Allergies  Objective/Physical Exam Neurovascular status intact.  Incision well coapted with sutures intact.  There is some slight erythema around the incision site.  Very localized.  Nonfluctuant.  No dehiscence. No active bleeding noted.  Moderate edema noted to the surgical extremity.  Assessment: 1. s/p Lapidus bunionectomy right. DOS: 06/15/2023   Plan of Care:  -Patient was evaluated. -Due to the slight erythema around the first TMT incision site prescription for doxycycline 100 mg 2 times daily #20. -Patient may begin washing and showering and getting the foot wet.  Recommend Betadine with light dressing daily -Minimal WBAT cam boot -Return to clinic 1 week  Felecia Shelling, DPM [facility]  Dr. Felecia Shelling, DPM    [address] [place], [place] [address]                Office [phone]  Fax [phone]

## 2023-07-11 ENCOUNTER — Other Ambulatory Visit: Payer: Self-pay

## 2023-07-14 ENCOUNTER — Ambulatory Visit: Payer: 59 | Admitting: Podiatry

## 2023-07-14 ENCOUNTER — Encounter: Payer: Self-pay | Admitting: Podiatry

## 2023-07-14 DIAGNOSIS — M2011 Hallux valgus (acquired), right foot: Secondary | ICD-10-CM

## 2023-07-14 NOTE — Progress Notes (Signed)
   Chief Complaint  Patient presents with   Routine Post Op    "I think it's slightly better.  It's less swollen and I can move it a little."    Subjective:  Patient presents today status post Lapidus bunionectomy right foot.  DOS: 06/15/2023.  Patient has been WBAT in the cam boot with the assistance of crutches.  Patient is taking the oral doxycycline.  She has noted significant improvement.  No past medical history on file.  Past Surgical History:  Procedure Laterality Date   ABDOMINAL HYSTERECTOMY     COLONOSCOPY WITH PROPOFOL N/A 05/13/2022   Procedure: COLONOSCOPY WITH PROPOFOL;  Surgeon: Wyline Mood, MD;  Location: [facility] ENDOSCOPY;  Service: Gastroenterology;  Laterality: N/A;   ESOPHAGOGASTRODUODENOSCOPY N/A 05/13/2022   Procedure: ESOPHAGOGASTRODUODENOSCOPY (EGD);  Surgeon: Wyline Mood, MD;  Location: [facility] ENDOSCOPY;  Service: Gastroenterology;  Laterality: N/A;   ESOPHAGOGASTRODUODENOSCOPY (EGD) WITH PROPOFOL N/A 11/17/2021   Procedure: ESOPHAGOGASTRODUODENOSCOPY (EGD) WITH PROPOFOL;  Surgeon: Regis Bill, MD;  Location: [facility] ENDOSCOPY;  Service: Endoscopy;  Laterality: N/A;    No Known Allergies   RT foot 07/07/2023  Objective/Physical Exam Neurovascular status intact.  Well adhered eschar noted along the proximal incision site.  There is no drainage.  No malodor.  Clinically there is no indication of acute infection or cellulitis.  Any of the erythema that was noted last week appears to be mostly resolved  Radiographic Exam RT foot 07/07/2023:  Orthopedic hardware and osteotomies sites appear to be stable with routine healing.  Good alignment of the first ray.  Assessment: 1. s/p Lapidus bunionectomy right. DOS: 06/15/2023   Plan of Care:  -Patient was evaluated. -Overall significant improvement and reduction of the erythema around the foot -Continue WBAT cam boot -Finish oral doxycycline until completed.  This is her second round -Continue Betadine wet-to-dry  to the proximal incision site -Return to clinic 1 week  *works at [employer] with Janalyn Rouse, DPM [facility]  Dr. Felecia Shelling, DPM    [address] [place], [place] [address]                Office [phone]  Fax [phone]

## 2023-07-25 ENCOUNTER — Encounter: Payer: Self-pay | Admitting: Podiatry

## 2023-07-25 ENCOUNTER — Ambulatory Visit (INDEPENDENT_AMBULATORY_CARE_PROVIDER_SITE_OTHER): Payer: 59 | Admitting: Podiatry

## 2023-07-25 DIAGNOSIS — M2011 Hallux valgus (acquired), right foot: Secondary | ICD-10-CM

## 2023-07-25 NOTE — Progress Notes (Signed)
   No chief complaint on file.   Subjective:  Patient presents today status post Lapidus bunionectomy right foot.  DOS: 06/15/2023.  Patient continues to be WBAT in the cam boot.  Overall she says there is improvement.  No new complaints  No past medical history on file.  Past Surgical History:  Procedure Laterality Date   ABDOMINAL HYSTERECTOMY     COLONOSCOPY WITH PROPOFOL N/A 05/13/2022   Procedure: COLONOSCOPY WITH PROPOFOL;  Surgeon: Wyline Mood, MD;  Location: [facility] ENDOSCOPY;  Service: Gastroenterology;  Laterality: N/A;   ESOPHAGOGASTRODUODENOSCOPY N/A 05/13/2022   Procedure: ESOPHAGOGASTRODUODENOSCOPY (EGD);  Surgeon: Wyline Mood, MD;  Location: [facility] ENDOSCOPY;  Service: Gastroenterology;  Laterality: N/A;   ESOPHAGOGASTRODUODENOSCOPY (EGD) WITH PROPOFOL N/A 11/17/2021   Procedure: ESOPHAGOGASTRODUODENOSCOPY (EGD) WITH PROPOFOL;  Surgeon: Regis Bill, MD;  Location: [facility] ENDOSCOPY;  Service: Endoscopy;  Laterality: N/A;    No Known Allergies   RT foot 07/07/2023  Objective/Physical Exam Neurovascular status intact.  Well adhered eschar noted along the proximal incision site which demonstrates significant improvement.  There is no drainage.  No malodor.  Clinically there is no indication of acute infection or cellulitis.  Any of the erythema that was noted last week appears to be mostly resolved  Radiographic Exam RT foot 07/07/2023:  Orthopedic hardware and osteotomies sites appear to be stable with routine healing.  Good alignment of the first ray.  Assessment: 1. s/p Lapidus bunionectomy right. DOS: 06/15/2023   Plan of Care:  -Patient was evaluated. - There continues to be significant improvement of the dehiscence site.  There is a well adhered scab which appears very stable.  The edges of the scab are healing nicely.  There is nice healthy pink viable skin along the margins. -Light debridement performed today. -Recommend range of motion exercises to the first MTP  to prevent scar tissue adhesion and stiffness to the joint -Return to clinic 4 weeks follow-up x-ray  *works at [employer] with Janalyn Rouse, DPM [facility]  Dr. Felecia Shelling, DPM    [address] [place], [place] [address]                Office [phone]  Fax [phone]

## 2023-07-31 ENCOUNTER — Encounter: Payer: Self-pay | Admitting: Podiatry

## 2023-07-31 ENCOUNTER — Other Ambulatory Visit: Payer: Self-pay

## 2023-07-31 ENCOUNTER — Other Ambulatory Visit: Payer: Self-pay | Admitting: Podiatry

## 2023-07-31 MED ORDER — DOXYCYCLINE HYCLATE 100 MG PO TABS
100.0000 mg | ORAL_TABLET | Freq: Two times a day (BID) | ORAL | 0 refills | Status: DC
Start: 2023-07-31 — End: 2024-08-16
  Filled 2023-07-31: qty 20, 10d supply, fill #0

## 2023-08-15 ENCOUNTER — Encounter: Payer: 59 | Admitting: Podiatry

## 2023-08-21 ENCOUNTER — Other Ambulatory Visit: Payer: Self-pay | Admitting: Obstetrics & Gynecology

## 2023-08-21 DIAGNOSIS — Z1231 Encounter for screening mammogram for malignant neoplasm of breast: Secondary | ICD-10-CM

## 2023-09-05 ENCOUNTER — Ambulatory Visit (INDEPENDENT_AMBULATORY_CARE_PROVIDER_SITE_OTHER): Payer: 59 | Admitting: Podiatry

## 2023-09-05 ENCOUNTER — Encounter: Payer: Self-pay | Admitting: Podiatry

## 2023-09-05 ENCOUNTER — Ambulatory Visit (INDEPENDENT_AMBULATORY_CARE_PROVIDER_SITE_OTHER): Payer: 59

## 2023-09-05 VITALS — BP 123/77 | HR 79

## 2023-09-05 DIAGNOSIS — Z9889 Other specified postprocedural states: Secondary | ICD-10-CM

## 2023-09-05 DIAGNOSIS — M960 Pseudarthrosis after fusion or arthrodesis: Secondary | ICD-10-CM

## 2023-09-12 ENCOUNTER — Other Ambulatory Visit: Payer: Self-pay | Admitting: Podiatry

## 2023-09-12 ENCOUNTER — Other Ambulatory Visit: Payer: Self-pay

## 2023-09-12 MED ORDER — IBUPROFEN 800 MG PO TABS
800.0000 mg | ORAL_TABLET | Freq: Three times a day (TID) | ORAL | 0 refills | Status: DC
  Filled 2023-09-12: qty 60, 20d supply, fill #0

## 2023-09-13 ENCOUNTER — Ambulatory Visit (INDEPENDENT_AMBULATORY_CARE_PROVIDER_SITE_OTHER): Payer: 59

## 2023-09-13 ENCOUNTER — Other Ambulatory Visit: Payer: Self-pay | Admitting: Podiatry

## 2023-09-13 DIAGNOSIS — M2011 Hallux valgus (acquired), right foot: Secondary | ICD-10-CM

## 2023-09-13 DIAGNOSIS — Z9889 Other specified postprocedural states: Secondary | ICD-10-CM

## 2023-09-18 NOTE — Progress Notes (Signed)
   Chief Complaint  Patient presents with   Routine Post Op    DOS: 06/15/2023  Lapidus bunionectomy right foot   "It's doing, it's still attached."    Subjective:  Patient presents today status post Lapidus bunionectomy right foot.  DOS: 06/15/2023.  Patient continues to have pain and tenderness assisted to the foot.  Pain with ambulation.  She says that throughout the day she does experience swelling and tenderness.  No past medical history on file.  Past Surgical History:  Procedure Laterality Date   ABDOMINAL HYSTERECTOMY     COLONOSCOPY WITH PROPOFOL N/A 05/13/2022   Procedure: COLONOSCOPY WITH PROPOFOL;  Surgeon: Wyline Mood, MD;  Location: [facility] ENDOSCOPY;  Service: Gastroenterology;  Laterality: N/A;   ESOPHAGOGASTRODUODENOSCOPY N/A 05/13/2022   Procedure: ESOPHAGOGASTRODUODENOSCOPY (EGD);  Surgeon: Wyline Mood, MD;  Location: [facility] ENDOSCOPY;  Service: Gastroenterology;  Laterality: N/A;   ESOPHAGOGASTRODUODENOSCOPY (EGD) WITH PROPOFOL N/A 11/17/2021   Procedure: ESOPHAGOGASTRODUODENOSCOPY (EGD) WITH PROPOFOL;  Surgeon: Regis Bill, MD;  Location: [facility] ENDOSCOPY;  Service: Endoscopy;  Laterality: N/A;    No Known Allergies   Objective/Physical Exam Neurovascular status intact.  The ulcer overlying the foot is resolved.  There continues to be some edema around the area.  Clinically no concern for infection  Radiographic Exam RT foot 09/05/2023:  Orthopedic hardware and osteotomies sites appear to be stable with routine healing.  Good alignment of the first ray.  Unfortunately there does appear to be nonunion of the first TMT/arthrodesis site.  Visualization of the joint is noted.  Assessment: 1. s/p Lapidus bunionectomy right. DOS: 06/15/2023 2.  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 - Patient continues to have pain and tenderness associated to the surgical foot. -Based on radiographic exam there is not appear to be any evidence of  fusion/union of the TMT.  Hardware is intact -Order placed for external bone stimulator.  Patient should be contacted by the rep to initiate bone stim -Continue wearing good supportive shoes and sneakers.  Advised against going barefoot. -Return to clinic 8 weeks follow-up x-ray  *works at [facility] [employer] with Janalyn Rouse, DPM [facility]  Dr. Felecia Shelling, DPM    [address] [place], [place] [address]                Office [phone]  Fax [phone]

## 2023-10-02 ENCOUNTER — Other Ambulatory Visit: Payer: Self-pay

## 2023-10-02 ENCOUNTER — Other Ambulatory Visit: Payer: Self-pay | Admitting: Podiatry

## 2023-10-02 ENCOUNTER — Telehealth (INDEPENDENT_AMBULATORY_CARE_PROVIDER_SITE_OTHER): Payer: 59 | Admitting: Podiatry

## 2023-10-02 DIAGNOSIS — M960 Pseudarthrosis after fusion or arthrodesis: Secondary | ICD-10-CM

## 2023-10-02 MED ORDER — IBUPROFEN 800 MG PO TABS
800.0000 mg | ORAL_TABLET | Freq: Three times a day (TID) | ORAL | 0 refills | Status: DC
  Filled 2023-10-02: qty 60, 20d supply, fill #0

## 2023-10-02 MED FILL — Pantoprazole Sodium EC Tab 40 MG (Base Equiv): ORAL | 90 days supply | Qty: 180 | Fill #1 | Status: AC

## 2023-10-02 NOTE — Addendum Note (Signed)
Addended by: Felecia Shelling on: 10/02/2023 01:33 PM   Modules accepted: Level of Service

## 2023-10-02 NOTE — Telephone Encounter (Signed)
Bone simulator rep came in asking for note for when xrays were done. They need addendum to the note for that.

## 2023-10-02 NOTE — Telephone Encounter (Signed)
   No chief complaint on file.   Subjective:  Patient status post Lapidus bunionectomy right foot.  DOS: 06/15/2023.  Patient presented to the office on 09/13/2023 for follow-up x-rays.  No past medical history on file.  Past Surgical History:  Procedure Laterality Date   ABDOMINAL HYSTERECTOMY     COLONOSCOPY WITH PROPOFOL N/A 05/13/2022   Procedure: COLONOSCOPY WITH PROPOFOL;  Surgeon: Wyline Mood, MD;  Location: [facility] ENDOSCOPY;  Service: Gastroenterology;  Laterality: N/A;   ESOPHAGOGASTRODUODENOSCOPY N/A 05/13/2022   Procedure: ESOPHAGOGASTRODUODENOSCOPY (EGD);  Surgeon: Wyline Mood, MD;  Location: [facility] ENDOSCOPY;  Service: Gastroenterology;  Laterality: N/A;   ESOPHAGOGASTRODUODENOSCOPY (EGD) WITH PROPOFOL N/A 11/17/2021   Procedure: ESOPHAGOGASTRODUODENOSCOPY (EGD) WITH PROPOFOL;  Surgeon: Regis Bill, MD;  Location: [facility] ENDOSCOPY;  Service: Endoscopy;  Laterality: N/A;    No Known Allergies   Radiographic Exam RT foot 09/13/2023:  Unchanged.  Orthopedic hardware and osteotomies sites appear to be stable with routine healing.  Good alignment of the first ray.  Continues to demonstrate nonunion of the first TMT/arthrodesis site.  Visualization of the joint is noted.  Assessment: 1. s/p Lapidus bunionectomy right. DOS: 06/15/2023 2.  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 Felecia Shelling, DPM    [address] [place], [place] [address]                Office [phone]  Fax [phone]

## 2023-10-02 NOTE — Telephone Encounter (Signed)
Addended. Please notify Trey Paula. Thanks, Dr. Logan Bores

## 2023-10-06 ENCOUNTER — Ambulatory Visit
Admission: RE | Admit: 2023-10-06 | Discharge: 2023-10-06 | Disposition: A | Discharge status: Home / Self Care | Payer: 59 | Source: Ambulatory Visit | Attending: Obstetrics & Gynecology | Admitting: Obstetrics & Gynecology

## 2023-10-06 DIAGNOSIS — Z1231 Encounter for screening mammogram for malignant neoplasm of breast: Secondary | ICD-10-CM | POA: Insufficient documentation

## 2023-10-17 ENCOUNTER — Ambulatory Visit: Payer: 59 | Admitting: Podiatry

## 2023-11-24 ENCOUNTER — Encounter: Payer: Self-pay | Admitting: Podiatry

## 2023-11-28 ENCOUNTER — Encounter: Payer: Self-pay | Admitting: Podiatry

## 2023-11-28 ENCOUNTER — Ambulatory Visit (INDEPENDENT_AMBULATORY_CARE_PROVIDER_SITE_OTHER): Payer: 59

## 2023-11-28 ENCOUNTER — Ambulatory Visit (INDEPENDENT_AMBULATORY_CARE_PROVIDER_SITE_OTHER): Payer: 59 | Admitting: Podiatry

## 2023-11-28 DIAGNOSIS — Z9889 Other specified postprocedural states: Secondary | ICD-10-CM

## 2023-11-28 NOTE — Progress Notes (Signed)
   Chief Complaint  Patient presents with   Routine Post Op    It just hurts.  I don't think the bone stimulator is doing anything.  I'm on it three hours a day, that's a bit much.    Subjective:  Patient presents today status post Lapidus bunionectomy right foot.  DOS: 06/15/2023.  Patient states that the foot is tolerable but she continues to have stiffness with sensitivity and tightness associated to the foot.  Occasional pain depending on activity.  No past medical history on file.  Past Surgical History:  Procedure Laterality Date   ABDOMINAL HYSTERECTOMY     COLONOSCOPY WITH PROPOFOL  N/A 05/13/2022   Procedure: COLONOSCOPY WITH PROPOFOL ;  Surgeon: Therisa Bi, MD;  Location: [facility] ENDOSCOPY;  Service: Gastroenterology;  Laterality: N/A;   ESOPHAGOGASTRODUODENOSCOPY N/A 05/13/2022   Procedure: ESOPHAGOGASTRODUODENOSCOPY (EGD);  Surgeon: Therisa Bi, MD;  Location: [facility] ENDOSCOPY;  Service: Gastroenterology;  Laterality: N/A;   ESOPHAGOGASTRODUODENOSCOPY (EGD) WITH PROPOFOL  N/A 11/17/2021   Procedure: ESOPHAGOGASTRODUODENOSCOPY (EGD) WITH PROPOFOL ;  Surgeon: Maryruth Ole DASEN, MD;  Location: [facility] ENDOSCOPY;  Service: Endoscopy;  Laterality: N/A;    No Known Allergies   Objective/Physical Exam Neurovascular status intact.  Incisions healed.  There is a discolored hyperkeratotic sensitive scar overlying the more proximal incision site overlying the TMT.  Associated tenderness and sensitivity with light touch and palpation There is also some joint stiffness to the first MTP.  Radiographic Exam RT foot 09/05/2023:  Orthopedic hardware and osteotomies sites appear to be stable with routine healing.  Good alignment of the first ray.  Unfortunately there does appear to be nonunion of the first TMT/arthrodesis site.  Visualization of the joint is noted.  Assessment: 1. s/p Lapidus bunionectomy right. DOS: 06/15/2023 2.  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 -Patient continues to have sensitivity with tightness associated to the surgical foot -Patient has been using the external bone stimulator for approximately 51 days.  Continue.  Recommended minimum 120 days.  There does appear to be improvement based on radiographic exam -Hardware and surgical site appears stable.  She may essentially resume full activity no restrictions as tolerated -Recommend Mederma scar cream and silicone scar strips overlying the incision site -Return to clinic 3 months follow-up x-ray.  At this time we may consider CT scan of the foot to ensure osseous healing and consider removal of hardware since both the patient and I do believe that some of her stiffness and tightness is associated to the implanted hardware  *works at [employer] with Etta Tomie Thresa EMERSON Janit, DPM [facility]  Dr. Thresa EMERSON Janit, DPM    [address] [place], [place] [address]                Office [phone]  Fax [phone]

## 2023-12-26 ENCOUNTER — Other Ambulatory Visit: Payer: Self-pay

## 2023-12-26 MED FILL — Pantoprazole Sodium EC Tab 40 MG (Base Equiv): ORAL | 90 days supply | Qty: 180 | Fill #2 | Status: AC

## 2023-12-27 ENCOUNTER — Ambulatory Visit (INDEPENDENT_AMBULATORY_CARE_PROVIDER_SITE_OTHER): Payer: 59 | Admitting: Obstetrics

## 2023-12-27 ENCOUNTER — Encounter: Payer: Self-pay | Admitting: Obstetrics

## 2023-12-27 VITALS — BP 110/70 | HR 80 | Ht 60.0 in | Wt 124.0 lb

## 2023-12-27 DIAGNOSIS — Z1231 Encounter for screening mammogram for malignant neoplasm of breast: Secondary | ICD-10-CM

## 2023-12-27 DIAGNOSIS — Z01419 Encounter for gynecological examination (general) (routine) without abnormal findings: Secondary | ICD-10-CM | POA: Diagnosis not present

## 2023-12-27 NOTE — Progress Notes (Signed)
 GYNECOLOGY: ANNUAL EXAM   Subjective:    PCP: Gretel App, NP Mallory Jenkins is a 48 y.o. female [phone] who presents for annual wellness visit.   Well Woman Visit:  GYN HISTORY:  No LMP recorded. Patient has had a hysterectomy.     Menstrual History: OB History     Gravida  2   Para  2   Term  2   Preterm      AB      Living  2      SAB      IAB      Ectopic      Multiple      Live Births              Menarche age: 44 No LMP recorded. Patient has had a hysterectomy.   Urinary incontinence? no  Sexually active: yes Number of sexual partners: 1 Gender of sexual Partners: male Social History   Substance and Sexual Activity  Sexual Activity Yes   Contraceptive methods:  hysterectomy Dyspareunia? no STI history: no STI/HIV testing or immunizations needed? No.   Health Maintenance: -Last pap: approximate date 07/22/21 and was normal NILM HPV NEGATIVE --> Any abnormals: yes -Last mammogram: 10/10/23 --> Any abnormals? no -Last colon cancer screen: 05/13/22 / Type: colonoscopy -Last DEXA scan: no -FMH of Breast / Colon / Cervical cancer: no -Vaccines:  There is no immunization history on file for this patient. Last Tdap: utd / Flu: utd / COVID: utd / Gardasil: age out / Shingles (50+): n/a / PCV20: n/a -Hep C screen: pcp -Last lipid / glucose screening: pcp  > Exercise: running/ jogging, very active > Dietary Supplements: Folate: No;  Calcium: No}; Vitamin D: No > Body mass index is 24.22 kg/m.  > Recent dental visit Yes.   > Seat Belt Use: Yes.   > Texting and driving? No. > Guns in the house No. > Recreational or other drug use: denied.   Social History   Tobacco Use   Smoking status: Never   Smokeless tobacco: Never  Substance Use Topics   Alcohol use: Never   Occupation: nurse   Lives with: spouse    PHQ-2 Score: In last two weeks, how often have you felt: Little interest or pleasure in doing things: Not at all  (0) Feeling down, depressed or hopeless: Not at all (0) Score: 0  GAD-2 Over the last 2 weeks, how often have you been bothered by the following problems? Feeling nervous, anxious or on edge: Not at all (0) Not being able to stop or control worrying: Not at all (0)} Score:0 _________________________________________________________  Current Outpatient Medications  Medication Sig Dispense Refill   pantoprazole  (PROTONIX ) 40 MG tablet Take 1 tablet (40 mg total) by mouth in the morning and at bedtime. 180 tablet 3   cetirizine (ZYRTEC) 10 MG tablet Take 10 mg by mouth daily as needed for allergies. (Patient not taking: Reported on 12/27/2023)     doxycycline  (VIBRA -TABS) 100 MG tablet Take 1 tablet (100 mg total) by mouth 2 (two) times daily. (Patient not taking: Reported on 12/27/2023) 20 tablet 0   ibuprofen  (ADVIL ) 800 MG tablet Take 1 tablet (800 mg total) by mouth 3 (three) times daily. (Patient not taking: Reported on 12/27/2023) 60 tablet 0   oxyCODONE -acetaminophen  (PERCOCET) 5-325 MG tablet Take 1 tablet by mouth every 4 (four) hours as needed for severe pain. (Patient not taking: Reported on 12/27/2023) 30 tablet 0   No current  facility-administered medications for this visit.   No Known Allergies  History reviewed. No pertinent past medical history. Past Surgical History:  Procedure Laterality Date   ABDOMINAL HYSTERECTOMY     COLONOSCOPY WITH PROPOFOL  N/A 05/13/2022   Procedure: COLONOSCOPY WITH PROPOFOL ;  Surgeon: Therisa Bi, MD;  Location: [facility] ENDOSCOPY;  Service: Gastroenterology;  Laterality: N/A;   ESOPHAGOGASTRODUODENOSCOPY N/A 05/13/2022   Procedure: ESOPHAGOGASTRODUODENOSCOPY (EGD);  Surgeon: Therisa Bi, MD;  Location: [facility] ENDOSCOPY;  Service: Gastroenterology;  Laterality: N/A;   ESOPHAGOGASTRODUODENOSCOPY (EGD) WITH PROPOFOL  N/A 11/17/2021   Procedure: ESOPHAGOGASTRODUODENOSCOPY (EGD) WITH PROPOFOL ;  Surgeon: Maryruth Ole DASEN, MD;  Location: [facility] ENDOSCOPY;  Service:  Endoscopy;  Laterality: N/A;    Review Of Systems  Constitutional: Denied constitutional symptoms, night sweats, recent illness, fatigue, fever, insomnia and weight loss.  Eyes: Denied eye symptoms, eye pain, photophobia, vision change and visual disturbance.  Ears/Nose/Throat/Neck: Denied ear, nose, throat or neck symptoms, hearing loss, nasal discharge, sinus congestion and sore throat.  Cardiovascular: Denied cardiovascular symptoms, arrhythmia, chest pain/pressure, edema, exercise intolerance, orthopnea and palpitations.  Respiratory: Denied pulmonary symptoms, asthma, pleuritic pain, productive sputum, cough, dyspnea and wheezing.  Gastrointestinal: Denied, gastro-esophageal reflux, melena, nausea and vomiting.  Genitourinary: Denied genitourinary symptoms including symptomatic vaginal discharge, pelvic relaxation issues, and urinary complaints.  Musculoskeletal: Denied musculoskeletal symptoms, stiffness, swelling, muscle weakness and myalgia.  Dermatologic: Denied dermatology symptoms, rash and scar.  Neurologic: Denied neurology symptoms, dizziness, headache, neck pain and syncope.  Psychiatric: Denied psychiatric symptoms, anxiety and depression.  Endocrine: Denied endocrine symptoms including hot flashes and night sweats.      Objective:    BP 110/70   Pulse 80   Ht 5' (1.524 m)   Wt 124 lb (56.2 kg)   BMI 24.22 kg/m   Constitutional: Well-developed, well-nourished female in no acute distress Neurological: Alert and oriented to person, place, and time Psychiatric: Mood and affect appropriate Skin: No rashes or lesions Neck: Supple without masses. Trachea is midline.Thyroid  is normal size without masses Lymphatics: No cervical, axillary, supraclavicular, or inguinal adenopathy noted Respiratory: Clear to auscultation bilaterally. Good air movement with normal work of breathing. Cardiovascular: Regular rate and rhythm. Extremities grossly normal, nontender with no edema;  pulses regular Gastrointestinal: Soft, nontender, nondistended. No masses or hernias appreciated. No hepatosplenomegaly. No fluid wave. No rebound or guarding. Breast Exam: normal appearance, no masses or tenderness, Inspection negative, No nipple retraction or dimpling, No nipple discharge or bleeding, No axillary or supraclavicular adenopathy, Normal to palpation without dominant masses Genitourinary:         External Genitalia: Normal female genitalia    Vagina: Normal mucosa, no lesions.    Cervix: surgically absent    Uterus: surgically absent Adnexae: Non-palpable and non-tender Perineum/Anus: No lesions Rectal: deferred    Assessment/Plan:    Mallory Jenkins is a 48 y.o. female G2P2002 with normal well-woman gynecologic exam.  -Screenings:  Pap: no longer indicated, s/p hysterectomy Mammogram: ordered, due Nov '25 Colon: UTD, PCP Labs: PCP GAD/PHQ-2 = 0 -Vaccines: UTD -Healthy lifestyle modifications discussed: multivitamin, diet, exercise, sunscreen, tobacco and alcohol use. Emphasized importance of regular physical activity.  -Calcium and Vit D recommendation reviewed.  -All questions answered to patient's satisfaction.  -RTC 1 yr for annual, sooner prn.    Estil Mangle, DO [place] [facility]

## 2023-12-27 NOTE — Patient Instructions (Signed)
 Preventive Care 50-50 Years Old, Female  Preventive care refers to lifestyle choices and visits with your health care provider that can promote health and wellness. Preventive care visits are also called wellness exams.  What can I expect for my preventive care visit?  Counseling  Your health care provider may ask you questions about your:  Medical history, including:  Past medical problems.  Family medical history.  Pregnancy history.  Current health, including:  Menstrual cycle.  Method of birth control.  Emotional well-being.  Home life and relationship well-being.  Sexual activity and sexual health.  Lifestyle, including:  Alcohol, nicotine or tobacco, and drug use.  Access to firearms.  Diet, exercise, and sleep habits.  Work and work Astronomer.  Sunscreen use.  Safety issues such as seatbelt and bike helmet use.  Physical exam  Your health care provider will check your:  Height and weight. These may be used to calculate your BMI (body mass index). BMI is a measurement that tells if you are at a healthy weight.  Waist circumference. This measures the distance around your waistline. This measurement also tells if you are at a healthy weight and may help predict your risk of certain diseases, such as type 2 diabetes and high blood pressure.  Heart rate and blood pressure.  Body temperature.  Skin for abnormal spots.  What immunizations do I need?    Vaccines are usually given at various ages, according to a schedule. Your health care provider will recommend vaccines for you based on your age, medical history, and lifestyle or other factors, such as travel or where you work.  What tests do I need?  Screening  Your health care provider may recommend screening tests for certain conditions. This may include:  Lipid and cholesterol levels.  Diabetes screening. This is done by checking your blood sugar (glucose) after you have not eaten for a while (fasting).  Pelvic exam and Pap test.  Hepatitis B test.  Hepatitis C  test.  HIV (human immunodeficiency virus) test.  STI (sexually transmitted infection) testing, if you are at risk.  Lung cancer screening.  Colorectal cancer screening.  Mammogram. Talk with your health care provider about when you should start having regular mammograms. This may depend on whether you have a family history of breast cancer.  BRCA-related cancer screening. This may be done if you have a family history of breast, ovarian, tubal, or peritoneal cancers.  Bone density scan. This is done to screen for osteoporosis.  Talk with your health care provider about your test results, treatment options, and if necessary, the need for more tests.  Follow these instructions at home:  Eating and drinking    Eat a diet that includes fresh fruits and vegetables, whole grains, lean protein, and low-fat dairy products.  Take vitamin and mineral supplements as recommended by your health care provider.  Do not drink alcohol if:  Your health care provider tells you not to drink.  You are pregnant, may be pregnant, or are planning to become pregnant.  If you drink alcohol:  Limit how much you have to 0-1 drink a day.  Know how much alcohol is in your drink. In the [place] one drink equals one 12 oz bottle of beer (355 mL), one 5 oz glass of wine (148 mL), or one 1 oz glass of hard liquor (44 mL).  Lifestyle  Brush your teeth every morning and night with fluoride toothpaste. Floss one time each day.  Exercise for at least  30 minutes 5 or more days each week.  Do not use any products that contain nicotine or tobacco. These products include cigarettes, chewing tobacco, and vaping devices, such as e-cigarettes. If you need help quitting, ask your health care provider.  Do not use drugs.  If you are sexually active, practice safe sex. Use a condom or other form of protection to prevent STIs.  If you do not wish to become pregnant, use a form of birth control. If you plan to become pregnant, see your health care provider for a  prepregnancy visit.  Take aspirin only as told by your health care provider. Make sure that you understand how much to take and what form to take. Work with your health care provider to find out whether it is safe and beneficial for you to take aspirin daily.  Find healthy ways to manage stress, such as:  Meditation, yoga, or listening to music.  Journaling.  Talking to a trusted person.  Spending time with friends and family.  Minimize exposure to UV radiation to reduce your risk of skin cancer.  Safety  Always wear your seat belt while driving or riding in a vehicle.  Do not drive:  If you have been drinking alcohol. Do not ride with someone who has been drinking.  When you are tired or distracted.  While texting.  If you have been using any mind-altering substances or drugs.  Wear a helmet and other protective equipment during sports activities.  If you have firearms in your house, make sure you follow all gun safety procedures.  Seek help if you have been physically or sexually abused.  What's next?  Visit your health care provider once a year for an annual wellness visit.  Ask your health care provider how often you should have your eyes and teeth checked.  Stay up to date on all vaccines.  This information is not intended to replace advice given to you by your health care provider. Make sure you discuss any questions you have with your health care provider.  Document Revised: 05/05/2021 Document Reviewed: 05/05/2021  Elsevier Patient Education  2024 [employer].

## 2024-02-27 ENCOUNTER — Ambulatory Visit (INDEPENDENT_AMBULATORY_CARE_PROVIDER_SITE_OTHER): Payer: 59 | Admitting: Podiatry

## 2024-02-27 ENCOUNTER — Encounter: Payer: Self-pay | Admitting: Podiatry

## 2024-02-27 ENCOUNTER — Ambulatory Visit (INDEPENDENT_AMBULATORY_CARE_PROVIDER_SITE_OTHER)

## 2024-02-27 DIAGNOSIS — M960 Pseudarthrosis after fusion or arthrodesis: Secondary | ICD-10-CM | POA: Diagnosis not present

## 2024-02-27 NOTE — Progress Notes (Signed)
   Chief Complaint  Patient presents with   Routine Post Op    DOS: 06/15/2023  Lapidus bunionectomy right foot.   "It is what it is at this point."    Subjective:  Patient presents today status post Lapidus bunionectomy right foot.  DOS: 06/15/2023.  Overall the patient doing well however she does have some intermittent achiness and stiffness to the toe joint.  No new complaints.  She is currently full activity in tennis shoes  No past medical history on file.  Past Surgical History:  Procedure Laterality Date   ABDOMINAL HYSTERECTOMY     COLONOSCOPY WITH PROPOFOL N/A 05/13/2022   Procedure: COLONOSCOPY WITH PROPOFOL;  Surgeon: Wyline Mood, MD;  Location: [facility] ENDOSCOPY;  Service: Gastroenterology;  Laterality: N/A;   ESOPHAGOGASTRODUODENOSCOPY N/A 05/13/2022   Procedure: ESOPHAGOGASTRODUODENOSCOPY (EGD);  Surgeon: Wyline Mood, MD;  Location: [facility] ENDOSCOPY;  Service: Gastroenterology;  Laterality: N/A;   ESOPHAGOGASTRODUODENOSCOPY (EGD) WITH PROPOFOL N/A 11/17/2021   Procedure: ESOPHAGOGASTRODUODENOSCOPY (EGD) WITH PROPOFOL;  Surgeon: Regis Bill, MD;  Location: [facility] ENDOSCOPY;  Service: Endoscopy;  Laterality: N/A;    No Known Allergies   Objective/Physical Exam Neurovascular status intact.  Incisions healed.  Persistent dark hyperkeratotic scar noted.  Associated tenderness around the hardware site of the Lapidus bunionectomy limited range of motion with some stiffness to the first MTP.  Radiographic Exam RT foot 02/27/2024:  Orthopedic hardware and osteotomies sites appear to be stable and unchanged.  Visual arthrodesis across the first TMT noted.  Assessment: 1. s/p Lapidus bunionectomy right. DOS: 06/15/2023   Plan of Care:  -Patient was evaluated. - Patient has been using the external bone stimulator daily.  At this time she can go ahead and discontinue the bone stimulator.  Radiographically there appears to be near complete osseous arthrodesis across the TMT -She  continues to have some hypersensitivity to the scar as well as stiffness throughout the hardware site and first MTP.  Today we discussed possibility of removal of hardware with revision of the hypertrophic scar and manipulation of the first MTP under anesthesia.  Discussed in detail with the patient including risk benefits advantages and disadvantages as well as the postoperative recovery course.  Patient has several events this summer so she will hold off for now. -Return to clinic as needed  *works at [employer] with Janalyn Rouse, DPM [facility]  Dr. Felecia Shelling, DPM    [address] [place], [place] [address]                Office [phone]  Fax [phone]

## 2024-03-26 MED FILL — Pantoprazole Sodium EC Tab 40 MG (Base Equiv): ORAL | 90 days supply | Qty: 180 | Fill #3 | Status: AC

## 2024-06-17 ENCOUNTER — Encounter: Payer: Self-pay | Admitting: Obstetrics & Gynecology

## 2024-06-23 ENCOUNTER — Other Ambulatory Visit: Payer: Self-pay | Admitting: Gastroenterology

## 2024-06-24 ENCOUNTER — Ambulatory Visit: Admitting: Obstetrics & Gynecology

## 2024-06-24 ENCOUNTER — Other Ambulatory Visit: Payer: Self-pay

## 2024-06-24 ENCOUNTER — Encounter: Payer: Self-pay | Admitting: Obstetrics & Gynecology

## 2024-06-24 ENCOUNTER — Ambulatory Visit

## 2024-06-24 ENCOUNTER — Other Ambulatory Visit: Payer: Self-pay | Admitting: Obstetrics & Gynecology

## 2024-06-24 ENCOUNTER — Other Ambulatory Visit: Payer: Self-pay | Admitting: Gastroenterology

## 2024-06-24 VITALS — BP 115/72 | HR 79 | Ht 60.0 in | Wt 122.0 lb

## 2024-06-24 DIAGNOSIS — Z0000 Encounter for general adult medical examination without abnormal findings: Secondary | ICD-10-CM | POA: Diagnosis not present

## 2024-06-24 DIAGNOSIS — N838 Other noninflammatory disorders of ovary, fallopian tube and broad ligament: Secondary | ICD-10-CM | POA: Diagnosis not present

## 2024-06-24 NOTE — Progress Notes (Signed)
    GYNECOLOGY PROGRESS NOTE  Subjective:    Patient ID: Alecia Doi, female    DOB: 12/26/1975, 48 y.o.   MRN: [id number]  HPI  Patient is a 48 y.o. [id number] here with a 1 month h/o feeling something prolapse from her vagina. She is a Charity fundraiser at [employer] She doesn't think that she has had sex in the last month. She has been doing pelvic floor exercises. She had a hysterectomy in the past, still has ovaries.  The following portions of the patient's history were reviewed and updated as appropriate: allergies, current medications, past family history, past medical history, past social history, past surgical history, and problem list.  Review of Systems Pertinent items are noted in HPI.   Objective:   Blood pressure 115/72, pulse 79, height 5' (1.524 m), weight 122 lb (55.3 kg). Body mass index is 23.83 kg/m. Well nourished, well hydrated White female, no apparent distress She is ambulating and conversing normally. EG- moderate VVA Rectocele noted Prominent urethral, mild prolapse Decent support of the vaginal cuff. Bimanual exam reveals a 5 cm left ovarian mass   Assessment:   1. Ovarian mass, left   2.      Rectocele  Plan:   1. Ovarian mass, left (Primary)  - US  PELVIC COMPLETE WITH TRANSVAGINAL; Future    Ultrasound showed a simple 6 cm left ovarian cyst I will check CA 125 and CEA She will follow up to discuss results and menopausal symptoms.

## 2024-06-25 ENCOUNTER — Other Ambulatory Visit: Payer: Self-pay

## 2024-06-25 ENCOUNTER — Encounter: Payer: Self-pay | Admitting: Obstetrics & Gynecology

## 2024-06-25 LAB — TSH: TSH: 1.87 u[IU]/mL (ref 0.450–4.500)

## 2024-06-25 LAB — CEA: CEA: 1.8 ng/mL (ref 0.0–4.7)

## 2024-06-25 LAB — CA 125: Cancer Antigen (CA) 125: 10.9 U/mL (ref 0.0–38.1)

## 2024-06-26 ENCOUNTER — Other Ambulatory Visit: Payer: Self-pay

## 2024-06-27 ENCOUNTER — Other Ambulatory Visit: Payer: Self-pay

## 2024-06-28 ENCOUNTER — Other Ambulatory Visit: Payer: Self-pay | Admitting: Gastroenterology

## 2024-06-28 ENCOUNTER — Other Ambulatory Visit: Payer: Self-pay

## 2024-07-01 ENCOUNTER — Other Ambulatory Visit: Payer: Self-pay | Admitting: Gastroenterology

## 2024-07-02 ENCOUNTER — Other Ambulatory Visit: Payer: Self-pay

## 2024-07-02 MED ORDER — PANTOPRAZOLE SODIUM 40 MG PO TBEC
40.0000 mg | DELAYED_RELEASE_TABLET | Freq: Two times a day (BID) | ORAL | 1 refills | Status: DC
  Filled 2024-07-02: qty 180, 90d supply, fill #0
  Filled 2024-09-26: qty 180, 90d supply, fill #1

## 2024-07-09 ENCOUNTER — Other Ambulatory Visit: Payer: Self-pay

## 2024-07-09 ENCOUNTER — Ambulatory Visit: Admitting: Obstetrics & Gynecology

## 2024-07-09 ENCOUNTER — Encounter: Payer: Self-pay | Admitting: Obstetrics & Gynecology

## 2024-07-09 VITALS — BP 124/82 | HR 69 | Ht 60.0 in | Wt 123.0 lb

## 2024-07-09 DIAGNOSIS — Z780 Asymptomatic menopausal state: Secondary | ICD-10-CM

## 2024-07-09 DIAGNOSIS — Z0000 Encounter for general adult medical examination without abnormal findings: Secondary | ICD-10-CM

## 2024-07-09 DIAGNOSIS — Z90710 Acquired absence of both cervix and uterus: Secondary | ICD-10-CM

## 2024-07-09 MED ORDER — ESTRADIOL 1 MG PO TABS
1.0000 mg | ORAL_TABLET | Freq: Every day | ORAL | 5 refills | Status: DC
  Filled 2024-07-09 (×2): qty 90, 90d supply, fill #0
  Filled 2024-09-12 – 2024-09-22 (×5): qty 90, 90d supply, fill #1

## 2024-07-09 NOTE — Progress Notes (Signed)
    GYNECOLOGY PROGRESS NOTE  Subjective:    Patient ID: Mallory Jenkins, female    DOB: [birth year], 48 y.o.   MRN: [id number]  HPI  Patient is a 48 y.o. married G2P2002 here today to discuss HRT/menopause symptoms. Symptoms started about 6 months ago. She had a hysterectomy 08/2019. She reports FSH of 40 in 2023. Main symptoms are hot flashes/night sweats/weight gain/thinning hair. She reports that her lipid profile is okay, is aware that ERT orally can worsen lipids. 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  The following portions of the patient's history were reviewed and updated as appropriate: allergies, current medications, past family history, past medical history, past social history, past surgical history, and problem list.  Review of Systems Pertinent items are noted in HPI.   Objective:   Height 5' (1.524 m), weight 123 lb (55.8 kg). Body mass index is 24.02 kg/m. Well nourished, well hydrated White female, no apparent distress She is ambulating and conversing normally. Exam deferred   Assessment:   Menopausal symptoms, no uterus  Plan:   Start estradiol  1 mg daily She may increase her dose if 1 mg doesn't fix symptoms

## 2024-07-25 ENCOUNTER — Telehealth: Payer: Self-pay | Admitting: Obstetrics & Gynecology

## 2024-07-25 NOTE — Telephone Encounter (Signed)
 Patient is trying to send you a message.  Will you send her a message so she can relay to you?  Thanks

## 2024-07-30 ENCOUNTER — Telehealth: Payer: Self-pay

## 2024-07-31 ENCOUNTER — Encounter: Payer: Self-pay | Admitting: Obstetrics & Gynecology

## 2024-07-31 NOTE — Telephone Encounter (Signed)
 We spoke after I received her message.  She does want to proceed with laparoscopic removal of both ovaries and with posterior repair. I sent a message to the surgical scheduler to put this on the schedule.

## 2024-08-20 ENCOUNTER — Encounter
Admission: RE | Admit: 2024-08-20 | Discharge: 2024-08-20 | Disposition: A | Discharge status: Home / Self Care | Source: Ambulatory Visit | Attending: Obstetrics & Gynecology | Admitting: Obstetrics & Gynecology

## 2024-08-20 ENCOUNTER — Other Ambulatory Visit: Payer: Self-pay

## 2024-08-20 HISTORY — DX: Rectocele: N81.6

## 2024-08-20 HISTORY — DX: Cystocele, unspecified: N81.10

## 2024-08-20 HISTORY — DX: Hallux valgus (acquired), right foot: M20.11

## 2024-08-20 HISTORY — DX: Other noninflammatory disorders of ovary, fallopian tube and broad ligament: N83.8

## 2024-08-20 NOTE — Patient Instructions (Signed)
 Your procedure is scheduled on: 08/26/24 - Monday Report to the Registration Desk on the 1st floor of the [facility]. To find out your arrival time, please call [phone] between 1PM - 3PM on: 08/23/24 - Friday If your arrival time is 6:00 am, do not arrive before that time as the [facility] entrance doors do not open until 6:00 am.  REMEMBER: Instructions that are not followed completely may result in serious medical risk, up to and including death; or upon the discretion of your surgeon and anesthesiologist your surgery may need to be rescheduled.  Do not eat food after midnight the night before surgery.  No gum chewing or hard candies.  You may however, drink CLEAR liquids up to 2 hours before you are scheduled to arrive for your surgery. Do not drink anything within 2 hours of your scheduled arrival time.  Clear liquids include: - water  - apple juice without pulp - gatorade (not RED colors) - black coffee or tea (Do NOT add milk or creamers to the coffee or tea) Do NOT drink anything that is not on this list.   One week prior to surgery: Stop Anti-inflammatories (NSAIDS) such as Advil , Aleve, Ibuprofen , Motrin , Naproxen, Naprosyn and Aspirin based products such as Excedrin, Goody's Powder, BC Powder. You may take Tylenol  if needed for pain up until the day of surgery.  Stop ANY OVER THE COUNTER supplements until after surgery.   ON THE DAY OF SURGERY ONLY TAKE THESE MEDICATIONS WITH SIPS OF WATER:  pantoprazole  (PROTONIX )    No Alcohol for 24 hours before or after surgery.  No Smoking including e-cigarettes for 24 hours before surgery.  No chewable tobacco products for at least 6 hours before surgery.  No nicotine patches on the day of surgery.  Do not use any recreational drugs for at least a week (preferably 2 weeks) before your surgery.  Please be advised that the combination of cocaine and anesthesia may have negative outcomes, up to and including death. If  you test positive for cocaine, your surgery will be cancelled.  On the morning of surgery brush your teeth with toothpaste and water, you may rinse your mouth with mouthwash if you wish. Do not swallow any toothpaste or mouthwash.  Use CHG Soap or wipes as directed on instruction sheet.  Do not wear jewelry, make-up, hairpins, clips or nail polish.  For welded (permanent) jewelry: bracelets, anklets, waist bands, etc.  Please have this removed prior to surgery.  If it is not removed, there is a chance that hospital personnel will need to cut it off on the day of surgery.  Do not wear lotions, powders, or perfumes.   Do not shave body hair from the neck down 48 hours before surgery.  Contact lenses, hearing aids and dentures may not be worn into surgery.  Do not bring valuables to the hospital. [facility] is not responsible for any missing/lost belongings or valuables.   Notify your doctor if there is any change in your medical condition (cold, fever, infection).  Wear comfortable clothing (specific to your surgery type) to the hospital.  After surgery, you can help prevent lung complications by doing breathing exercises.  Take deep breaths and cough every 1-2 hours. Your doctor may order a device called an Incentive Spirometer to help you take deep breaths. When coughing or sneezing, hold a pillow firmly against your incision with both hands. This is called "splinting." Doing this helps protect your incision. It also decreases belly  discomfort.  If you are being admitted to the hospital overnight, leave your suitcase in the car. After surgery it may be brought to your room.  In case of increased patient census, it may be necessary for you, the patient, to continue your postoperative care in the Same Day Surgery department.  If you are being discharged the day of surgery, you will not be allowed to drive home. You will need a responsible individual to drive you home and stay with you  for 24 hours after surgery.   If you are taking public transportation, you will need to have a responsible individual with you.  Please call the Pre-admissions Testing Dept. at [phone] if you have any questions about these instructions.  Surgery Visitation Policy:  Patients having surgery or a procedure may have two visitors.  Children under the age of 41 must have an adult with them who is not the patient.  Inpatient Visitation:    Visiting hours are 7 a.m. to 8 p.m. Up to four visitors are allowed at one time in a patient room. The visitors may rotate out with other people during the day.  One visitor age 98 or older may stay with the patient overnight and must be in the room by 8 p.m.   Merchandiser, retail to address health-related social needs:  [email or web address]                                                                                                             Preparing for Surgery with CHLORHEXIDINE GLUCONATE (CHG) Soap  Chlorhexidine Gluconate (CHG) Soap  o An antiseptic cleaner that kills germs and bonds with the skin to continue killing germs even after washing  o Used for showering the night before surgery and morning of surgery  Before surgery, you can play an important role by reducing the number of germs on your skin.  CHG (Chlorhexidine gluconate) soap is an antiseptic cleanser which kills germs and bonds with the skin to continue killing germs even after washing.  Please do not use if you have an allergy to CHG or antibacterial soaps. If your skin becomes reddened/irritated stop using the CHG.  1. Shower the NIGHT BEFORE SURGERY and the MORNING OF SURGERY with CHG soap.  2. If you choose to wash your hair, wash your hair first as usual with your normal shampoo.  3. After shampooing, rinse your hair and body thoroughly to remove the shampoo.  4. Use CHG as you would any other liquid soap. You can apply CHG directly to the  skin and wash gently with a scrungie or a clean washcloth.  5. Apply the CHG soap to your body only from the neck down. Do not use on open wounds or open sores. Avoid contact with your eyes, ears, mouth, and genitals (private parts). Wash face and genitals (private parts) with your normal soap.  6. Wash thoroughly, paying special attention to the area where your surgery will be performed.  7. Thoroughly rinse your body with warm water.  8.  Do not shower/wash with your normal soap after using and rinsing off the CHG soap.  9. Pat yourself dry with a clean towel.  10. Wear clean pajamas to bed the night before surgery.  12. Place clean sheets on your bed the night of your first shower and do not sleep with pets.  13. Shower again with the CHG soap on the day of surgery prior to arriving at the hospital.  14. Do not apply any deodorants/lotions/powders.  15. Please wear clean clothes to the hospital.

## 2024-08-21 ENCOUNTER — Encounter
Admission: RE | Admit: 2024-08-21 | Discharge: 2024-08-21 | Disposition: A | Discharge status: Home / Self Care | Source: Ambulatory Visit | Attending: Obstetrics & Gynecology | Admitting: Obstetrics & Gynecology

## 2024-08-21 ENCOUNTER — Encounter: Payer: Self-pay | Admitting: Urgent Care

## 2024-08-21 DIAGNOSIS — N83202 Unspecified ovarian cyst, left side: Secondary | ICD-10-CM | POA: Insufficient documentation

## 2024-08-21 DIAGNOSIS — N816 Rectocele: Secondary | ICD-10-CM | POA: Diagnosis not present

## 2024-08-21 DIAGNOSIS — Z01812 Encounter for preprocedural laboratory examination: Secondary | ICD-10-CM | POA: Insufficient documentation

## 2024-08-21 LAB — CBC
HCT: 40.1 % (ref 36.0–46.0)
Hemoglobin: 13.8 g/dL (ref 12.0–15.0)
MCH: 31.3 pg (ref 26.0–34.0)
MCHC: 34.4 g/dL (ref 30.0–36.0)
MCV: 90.9 fL (ref 80.0–100.0)
Platelets: 205 K/uL (ref 150–400)
RBC: 4.41 MIL/uL (ref 3.87–5.11)
RDW: 11.9 % (ref 11.5–15.5)
WBC: 4.9 K/uL (ref 4.0–10.5)
nRBC: 0 % (ref 0.0–0.2)

## 2024-08-21 LAB — TYPE AND SCREEN
ABO/RH(D): B POS
Antibody Screen: NEGATIVE

## 2024-08-23 ENCOUNTER — Telehealth: Payer: Self-pay | Admitting: Obstetrics & Gynecology

## 2024-08-23 NOTE — Telephone Encounter (Signed)
 Patient aware that her surgery on 08/26/2024 needs to be rescheduled due to provider being unavailable to perform surgery this date. Patient has agreed to new surgery date of 09/16/2024. Patient states that she will check with her spouse to make sure he is in town. If anything changes she will give office a call with request for new date.

## 2024-08-26 DIAGNOSIS — N816 Rectocele: Secondary | ICD-10-CM

## 2024-08-26 DIAGNOSIS — Z01812 Encounter for preprocedural laboratory examination: Secondary | ICD-10-CM

## 2024-08-26 DIAGNOSIS — N83202 Unspecified ovarian cyst, left side: Secondary | ICD-10-CM

## 2024-09-04 NOTE — Telephone Encounter (Signed)
 Pt has apt with Dr.Dove

## 2024-09-12 ENCOUNTER — Other Ambulatory Visit: Payer: Self-pay

## 2024-09-15 MED ORDER — ORAL CARE MOUTH RINSE: 15.0000 mL | Freq: Once | OROMUCOSAL | Status: AC

## 2024-09-15 MED ORDER — LACTATED RINGERS IV SOLN: INTRAVENOUS | Status: DC

## 2024-09-15 MED ORDER — CHLORHEXIDINE GLUCONATE 0.12 % MT SOLN
15.0000 mL | Freq: Once | OROMUCOSAL | Status: AC
  Administered 2024-09-16: 15 mL via OROMUCOSAL

## 2024-09-15 MED ORDER — POVIDONE-IODINE 10 % EX SWAB
2.0000 | Freq: Once | CUTANEOUS | Status: AC
  Administered 2024-09-16: 2 via TOPICAL

## 2024-09-16 ENCOUNTER — Other Ambulatory Visit: Payer: Self-pay

## 2024-09-16 ENCOUNTER — Ambulatory Visit
Admission: RE | Admit: 2024-09-16 | Discharge: 2024-09-16 | Disposition: A | Discharge status: Home / Self Care | Attending: Obstetrics & Gynecology | Admitting: Obstetrics & Gynecology

## 2024-09-16 ENCOUNTER — Encounter: Payer: Self-pay | Admitting: Obstetrics & Gynecology

## 2024-09-16 ENCOUNTER — Encounter
Admission: RE | Disposition: A | Discharge status: Home / Self Care | Payer: Self-pay | Source: Home / Self Care | Attending: Obstetrics & Gynecology

## 2024-09-16 ENCOUNTER — Ambulatory Visit: Payer: Self-pay | Admitting: Urgent Care

## 2024-09-16 ENCOUNTER — Ambulatory Visit

## 2024-09-16 DIAGNOSIS — N816 Rectocele: Secondary | ICD-10-CM | POA: Diagnosis not present

## 2024-09-16 DIAGNOSIS — N83202 Unspecified ovarian cyst, left side: Secondary | ICD-10-CM | POA: Insufficient documentation

## 2024-09-16 DIAGNOSIS — Z01812 Encounter for preprocedural laboratory examination: Secondary | ICD-10-CM

## 2024-09-16 DIAGNOSIS — D270 Benign neoplasm of right ovary: Secondary | ICD-10-CM | POA: Diagnosis not present

## 2024-09-16 DIAGNOSIS — K219 Gastro-esophageal reflux disease without esophagitis: Secondary | ICD-10-CM | POA: Diagnosis not present

## 2024-09-16 DIAGNOSIS — N838 Other noninflammatory disorders of ovary, fallopian tube and broad ligament: Secondary | ICD-10-CM | POA: Diagnosis not present

## 2024-09-16 DIAGNOSIS — Z90710 Acquired absence of both cervix and uterus: Secondary | ICD-10-CM | POA: Insufficient documentation

## 2024-09-16 DIAGNOSIS — D271 Benign neoplasm of left ovary: Secondary | ICD-10-CM

## 2024-09-16 HISTORY — PX: RECTOCELE REPAIR: SHX761

## 2024-09-16 HISTORY — PX: LAPAROSCOPIC BILATERAL SALPINGO OOPHERECTOMY: SHX5890

## 2024-09-16 LAB — TYPE AND SCREEN
ABO/RH(D): B POS
Antibody Screen: NEGATIVE

## 2024-09-16 SURGERY — SALPINGO-OOPHORECTOMY, BILATERAL, LAPAROSCOPIC
Anesthesia: General

## 2024-09-16 MED ORDER — ONDANSETRON HCL 4 MG/2ML IJ SOLN
INTRAMUSCULAR | Status: DC | PRN
  Administered 2024-09-16: 4 mg via INTRAVENOUS

## 2024-09-16 MED ORDER — MIDAZOLAM HCL 2 MG/2ML IJ SOLN
INTRAMUSCULAR | Status: AC
  Filled 2024-09-16: qty 2

## 2024-09-16 MED ORDER — KETAMINE HCL 50 MG/5ML IJ SOSY
PREFILLED_SYRINGE | INTRAMUSCULAR | Status: DC | PRN
  Administered 2024-09-16: 25 mg via INTRAVENOUS

## 2024-09-16 MED ORDER — OXYCODONE HCL 5 MG PO TABS
5.0000 mg | ORAL_TABLET | ORAL | 0 refills | Status: AC | PRN
  Filled 2024-09-16: qty 10, 2d supply, fill #0

## 2024-09-16 MED ORDER — SODIUM CHLORIDE (PF) 0.9 % IJ SOLN
INTRAMUSCULAR | Status: AC
  Filled 2024-09-16: qty 50

## 2024-09-16 MED ORDER — FENTANYL CITRATE (PF) 100 MCG/2ML IJ SOLN
INTRAMUSCULAR | Status: DC | PRN
  Administered 2024-09-16 (×2): 50 ug via INTRAVENOUS

## 2024-09-16 MED ORDER — PHENYLEPHRINE 80 MCG/ML (10ML) SYRINGE FOR IV PUSH (FOR BLOOD PRESSURE SUPPORT)
PREFILLED_SYRINGE | INTRAVENOUS | Status: DC | PRN
  Administered 2024-09-16 (×2): 80 ug via INTRAVENOUS

## 2024-09-16 MED ORDER — CHLORHEXIDINE GLUCONATE 0.12 % MT SOLN
OROMUCOSAL | Status: AC
  Filled 2024-09-16: qty 15

## 2024-09-16 MED ORDER — PROPOFOL 1000 MG/100ML IV EMUL
INTRAVENOUS | Status: AC
  Filled 2024-09-16: qty 100

## 2024-09-16 MED ORDER — FENTANYL CITRATE (PF) 100 MCG/2ML IJ SOLN: 25.0000 ug | INTRAMUSCULAR | Status: DC | PRN

## 2024-09-16 MED ORDER — VASOPRESSIN 20 UNIT/ML IV SOLN
INTRAVENOUS | Status: AC
Start: 2024-09-16 — End: 2024-09-16
  Filled 2024-09-16: qty 1

## 2024-09-16 MED ORDER — PROPOFOL 10 MG/ML IV BOLUS
INTRAVENOUS | Status: DC | PRN
  Administered 2024-09-16: 100 mg via INTRAVENOUS

## 2024-09-16 MED ORDER — BUPIVACAINE HCL (PF) 0.5 % IJ SOLN
INTRAMUSCULAR | Status: AC
  Filled 2024-09-16: qty 30

## 2024-09-16 MED ORDER — SUGAMMADEX SODIUM 200 MG/2ML IV SOLN
INTRAVENOUS | Status: DC | PRN
  Administered 2024-09-16: 200 mg via INTRAVENOUS

## 2024-09-16 MED ORDER — LIDOCAINE HCL (PF) 2 % IJ SOLN
INTRAMUSCULAR | Status: AC
  Filled 2024-09-16: qty 5

## 2024-09-16 MED ORDER — OXYCODONE HCL 5 MG PO TABS: 5.0000 mg | ORAL_TABLET | Freq: Once | ORAL | Status: DC | PRN

## 2024-09-16 MED ORDER — IBUPROFEN 600 MG PO TABS
600.0000 mg | ORAL_TABLET | Freq: Four times a day (QID) | ORAL | 1 refills | Status: AC | PRN
Start: 2024-09-16 — End: ?
  Filled 2024-09-16: qty 30, 8d supply, fill #0

## 2024-09-16 MED ORDER — PROPOFOL 10 MG/ML IV BOLUS
INTRAVENOUS | Status: AC
  Filled 2024-09-16: qty 20

## 2024-09-16 MED ORDER — SODIUM CHLORIDE (PF) 0.9 % IJ SOLN
INTRAMUSCULAR | Status: DC | PRN
  Administered 2024-09-16: 38 mL via INTRAMUSCULAR

## 2024-09-16 MED ORDER — SODIUM CHLORIDE 0.9 % IR SOLN
Status: DC | PRN
  Administered 2024-09-16: 1000 mL

## 2024-09-16 MED ORDER — 0.9 % SODIUM CHLORIDE (POUR BTL) OPTIME
TOPICAL | Status: DC | PRN
Start: 2024-09-16 — End: 2024-09-16
  Administered 2024-09-16: 1000 mL

## 2024-09-16 MED ORDER — OXYCODONE HCL 5 MG/5ML PO SOLN: 5.0000 mg | Freq: Once | ORAL | Status: DC | PRN

## 2024-09-16 MED ORDER — KETAMINE HCL 50 MG/5ML IJ SOSY
PREFILLED_SYRINGE | INTRAMUSCULAR | Status: AC
  Filled 2024-09-16: qty 5

## 2024-09-16 MED ORDER — ROCURONIUM BROMIDE 100 MG/10ML IV SOLN
INTRAVENOUS | Status: DC | PRN
  Administered 2024-09-16: 10 mg via INTRAVENOUS
  Administered 2024-09-16: 40 mg via INTRAVENOUS
  Administered 2024-09-16: 10 mg via INTRAVENOUS
  Administered 2024-09-16: 20 mg via INTRAVENOUS

## 2024-09-16 MED ORDER — PROPOFOL 500 MG/50ML IV EMUL
INTRAVENOUS | Status: DC | PRN
  Administered 2024-09-16: 30 ug/kg/min via INTRAVENOUS

## 2024-09-16 MED ORDER — FENTANYL CITRATE (PF) 100 MCG/2ML IJ SOLN
INTRAMUSCULAR | Status: AC
  Filled 2024-09-16: qty 2

## 2024-09-16 MED ORDER — BUPIVACAINE HCL (PF) 0.5 % IJ SOLN
INTRAMUSCULAR | Status: DC | PRN
  Administered 2024-09-16: 10 mL

## 2024-09-16 MED ORDER — KETOROLAC TROMETHAMINE 30 MG/ML IJ SOLN
INTRAMUSCULAR | Status: DC | PRN
  Administered 2024-09-16: 15 mg via INTRAVENOUS

## 2024-09-16 MED ORDER — DEXAMETHASONE SOD PHOSPHATE PF 10 MG/ML IJ SOLN
INTRAMUSCULAR | Status: DC | PRN
  Administered 2024-09-16: 10 mg via INTRAVENOUS

## 2024-09-16 MED ORDER — MIDAZOLAM HCL (PF) 2 MG/2ML IJ SOLN
INTRAMUSCULAR | Status: DC | PRN
  Administered 2024-09-16: 2 mg via INTRAVENOUS

## 2024-09-16 MED ORDER — ACETAMINOPHEN 10 MG/ML IV SOLN
INTRAVENOUS | Status: DC | PRN
  Administered 2024-09-16: 1000 mg via INTRAVENOUS

## 2024-09-16 MED ORDER — LIDOCAINE HCL (CARDIAC) PF 100 MG/5ML IV SOSY
PREFILLED_SYRINGE | INTRAVENOUS | Status: DC | PRN
  Administered 2024-09-16: 60 mg via INTRAVENOUS

## 2024-09-16 MED ORDER — ACETAMINOPHEN 325 MG PO TABS
650.0000 mg | ORAL_TABLET | ORAL | 2 refills | Status: AC | PRN
Start: 2024-09-16 — End: 2025-09-16
  Filled 2024-09-16: qty 100, 9d supply, fill #0

## 2024-09-16 MED ORDER — GLYCOPYRROLATE 0.2 MG/ML IJ SOLN
INTRAMUSCULAR | Status: DC | PRN
  Administered 2024-09-16: .1 mg via INTRAVENOUS

## 2024-09-16 SURGICAL SUPPLY — 50 items
ADHESIVE MASTISOL STRL (MISCELLANEOUS) IMPLANT
BAG DECANTER FOR FLEXI CONT (MISCELLANEOUS) ×2 IMPLANT
BLADE SURG SZ10 CARB STEEL (BLADE) ×2 IMPLANT
CATH FOLEY 2WAY SIL 16X30 (CATHETERS) ×2 IMPLANT
CATH ROBINSON RED A/P 16FR (CATHETERS) IMPLANT
CHLORAPREP W/TINT 26 (MISCELLANEOUS) ×2 IMPLANT
CLEANER CAUTERY TIP PAD (MISCELLANEOUS) IMPLANT
DEFOGGER SCOPE WARM SEASHARP (MISCELLANEOUS) IMPLANT
DRAPE PERI LITHO V/GYN (MISCELLANEOUS) ×2 IMPLANT
DRAPE UTILITY 15X26 TOWEL STRL (DRAPES) ×2 IMPLANT
DRSG OPSITE POSTOP 3X4 (GAUZE/BANDAGES/DRESSINGS) IMPLANT
ELECTRODE REM PT RTRN 9FT ADLT (ELECTROSURGICAL) ×2 IMPLANT
GAUZE 4X4 16PLY ~~LOC~~+RFID DBL (SPONGE) ×8 IMPLANT
GLOVE BIO SURGEON STRL SZ 6.5 (GLOVE) ×4 IMPLANT
GLOVE BIOGEL PI IND STRL 7.0 (GLOVE) ×4 IMPLANT
GOWN STRL REUS W/ TWL LRG LVL3 (GOWN DISPOSABLE) ×4 IMPLANT
GOWN STRL REUS W/ TWL XL LVL3 (GOWN DISPOSABLE) ×2 IMPLANT
HANDLE YANKAUER SUCT BULB TIP (MISCELLANEOUS) IMPLANT
IRRIGATION STRYKERFLOW (MISCELLANEOUS) IMPLANT
KIT PINK PAD W/HEAD ARM REST (MISCELLANEOUS) IMPLANT
MANIFOLD NEPTUNE II (INSTRUMENTS) ×2 IMPLANT
NDL HYPO 22X1.5 SAFETY MO (MISCELLANEOUS) ×2 IMPLANT
NDL HYPO 25X1 1.5 SAFETY (NEEDLE) ×2 IMPLANT
NDL INSUFFLATION 14GA 120MM (NEEDLE) ×2 IMPLANT
NEEDLE HYPO 22X1.5 SAFETY MO (MISCELLANEOUS) ×2 IMPLANT
NEEDLE HYPO 25X1 1.5 SAFETY (NEEDLE) IMPLANT
NEEDLE INSUFFLATION 14GA 120MM (NEEDLE) ×2 IMPLANT
NS IRRIG 500ML POUR BTL (IV SOLUTION) ×2 IMPLANT
PACK BASIN MINOR ARMC (MISCELLANEOUS) ×2 IMPLANT
PACK GYN LAPAROSCOPIC (MISCELLANEOUS) ×2 IMPLANT
PAD OB MATERNITY 11 LF (PERSONAL CARE ITEMS) ×2 IMPLANT
SCRUB CHG 4% DYNA-HEX 4OZ (MISCELLANEOUS) ×2 IMPLANT
SHEARS HARMONIC 36 ACE (MISCELLANEOUS) ×2 IMPLANT
SLEEVE Z-THREAD 5X100MM (TROCAR) ×4 IMPLANT
STRIP CLOSURE SKIN 1/2X4 (GAUZE/BANDAGES/DRESSINGS) ×2 IMPLANT
SURGILUBE 2OZ TUBE FLIPTOP (MISCELLANEOUS) ×2 IMPLANT
SUT VIC AB 0 CT1 27XCR 8 STRN (SUTURE) ×4 IMPLANT
SUT VIC AB 0 CT1 36 (SUTURE) ×2 IMPLANT
SUT VIC AB 2-0 SH 27XBRD (SUTURE) IMPLANT
SUT VIC AB 4-0 PS2 27 (SUTURE) ×2 IMPLANT
SUT VICRYL 0 UR6 27IN ABS (SUTURE) ×4 IMPLANT
SYR 10ML LL (SYRINGE) ×4 IMPLANT
SYR 30ML LL (SYRINGE) ×2 IMPLANT
SYSTEM BAG RETRIEVAL 10MM (BASKET) IMPLANT
TOWEL OR 17X26 4PK STRL BLUE (TOWEL DISPOSABLE) ×4 IMPLANT
TRAP FLUID SMOKE EVACUATOR (MISCELLANEOUS) ×2 IMPLANT
TROCAR Z-THRD FIOS HNDL 11X100 (TROCAR) IMPLANT
TROCAR Z-THREAD FIOS 5X100MM (TROCAR) ×4 IMPLANT
TUBING EVAC SMOKE HEATED PNEUM (TUBING) ×2 IMPLANT
WATER STERILE IRR 500ML POUR (IV SOLUTION) ×2 IMPLANT

## 2024-09-16 NOTE — Transfer of Care (Signed)
 Immediate Anesthesia Transfer of Care Note  Patient: Mallory Jenkins  Procedure(s) Performed: SALPINGO-OOPHORECTOMY, BILATERAL, LAPAROSCOPIC (Bilateral) COLPORRHAPHY, POSTERIOR, FOR RECTOCELE REPAIR  Patient Location: PACU  Anesthesia Type:General  Level of Consciousness: drowsy  Airway & Oxygen Therapy: Patient Spontanous Breathing and Patient connected to face mask oxygen  Post-op Assessment: Report given to RN, Post -op Vital signs reviewed and stable, and Patient moving all extremities X 4  Post vital signs: Reviewed and stable  Last Vitals:  Vitals Value Taken Time  BP 100/64 09/16/24 10:12  Temp  97.4 F  09/16/24 1012  Pulse 71 09/16/24 10:13  Resp 21 09/16/24 10:13  SpO2 100 % 09/16/24 10:13  Vitals shown include unfiled device data.  Last Pain:  Vitals:   09/16/24 0617  TempSrc: Temporal  PainSc: 0-No pain         Complications: No notable events documented.

## 2024-09-16 NOTE — H&P (Signed)
 Mallory Jenkins is an 48 yo P2 here for laparoscopic bilateral salpingectomy and posterior repair. I met her in August as a new patient. She was concerned about a new onset rectocele. During the exam I palpated a pelvic mass. A pelvic ultrasound noted a 7 cm left ovarian simple cyst. CA 125 and CEA were normal. She reports that pelvic pain is now fairly constant, keeping her from her usual exercise. She had a vaginal hysterectomy and bladder sling in the past. She has been taking estradiol  1 mg daily.   No LMP recorded. Patient has had a hysterectomy.    Past Medical History:  Diagnosis Date   GERD (gastroesophageal reflux disease)    Hallux abducto valgus, right    Ovarian mass, left    Rectocele    Vaginal prolapse     Past Surgical History:  Procedure Laterality Date   ABDOMINAL HYSTERECTOMY     COLONOSCOPY WITH PROPOFOL  N/A 05/13/2022   Procedure: COLONOSCOPY WITH PROPOFOL ;  Surgeon: Therisa Bi, MD;  Location: [facility] ENDOSCOPY;  Service: Gastroenterology;  Laterality: N/A;   ESOPHAGOGASTRODUODENOSCOPY N/A 05/13/2022   Procedure: ESOPHAGOGASTRODUODENOSCOPY (EGD);  Surgeon: Therisa Bi, MD;  Location: [facility] ENDOSCOPY;  Service: Gastroenterology;  Laterality: N/A;   ESOPHAGOGASTRODUODENOSCOPY (EGD) WITH PROPOFOL  N/A 11/17/2021   Procedure: ESOPHAGOGASTRODUODENOSCOPY (EGD) WITH PROPOFOL ;  Surgeon: Maryruth Ole DASEN, MD;  Location: [facility] ENDOSCOPY;  Service: Endoscopy;  Laterality: N/A;   Right Lapidus bunionectomy      History reviewed. No pertinent family history.  Social History:  reports that she has never smoked. She has never used smokeless tobacco. She reports that she does not drink alcohol and does not use drugs.  Allergies: No Known Allergies  Medications Prior to Admission  Medication Sig Dispense Refill Last Dose/Taking   cetirizine (ZYRTEC) 10 MG tablet Take 10 mg by mouth daily as needed for allergies.   09/15/2024   estradiol  (ESTRACE ) 1 MG tablet Take 1 tablet (1 mg  total) by mouth daily. 90 tablet 5 09/15/2024   pantoprazole  (PROTONIX ) 40 MG tablet Take 1 tablet (40 mg total) by mouth 2 (two) times daily. 180 tablet 1 09/16/2024 Morning    Review of Systems  Blood pressure 106/84, pulse 82, temperature 97.6 F (36.4 C), temperature source Temporal, resp. rate 18, height 5' (1.524 m), weight 52.6 kg, SpO2 97%. Physical Exam Well nourished, well hydrated White female, no apparent distress She is ambulating and conversing normally. CONSTITUTIONAL: Well-developed, well-nourished female in no acute distress.  PSYCHIATRIC: Normal mood and affect. Normal behavior. Normal judgment and thought content. NEUROLGIC: Alert and oriented to person, place, and time. Normal muscle tone coordination. No cranial nerve deficit noted. HENT:  Normocephalic, atraumatic, External right and left ear normal. Oropharynx is clear and moist EYES: Conjunctivae and EOM are normal. Pupils are equal, round, and reactive to light. No scleral icterus.  NECK: Normal range of motion, supple, no masses.  Normal thyroid.  SKIN: Skin is warm and dry. No rash noted. Not diaphoretic. No erythema. No pallor. CARDIOVASCULAR: Normal heart rate noted, regular rhythm, no murmur. RESPIRATORY: Clear to auscultation bilaterally. Effort and breath sounds normal, no problems with respiration noted. BREASTS: Symmetric in size. No masses, skin changes, nipple drainage, or lymphadenopathy. ABDOMEN: Soft, normal bowel sounds, no distention noted.  No tenderness, rebound or guarding.  BLADDER: Normal PELVIC: Grade 2 rectocele 7 cm pelvic mass, generally on the left MUSCULOSKELETAL: Normal range of motion. No tenderness.  No cyanosis, clubbing, or edema.  2+ distal pulses. LYMPHATIC: No Axillary, Supraclavicular,  or Inguinal Adenopathy.   Results for orders placed or performed during the hospital encounter of 09/16/24 (from the past 24 hours)  Type and screen [facility]     Status:  None (Preliminary result)   Collection Time: 09/16/24  6:39 AM  Result Value Ref Range   ABO/RH(D) PENDING    Antibody Screen PENDING    Sample Expiration      09/19/2024,2359 Performed at [facility], [address] [place], [place] [address]    HBG 13.8  Assessment/Plan: Symptomatic left ovarian cyst- plan for laparoscopic bilateral salpingoopherectomy  Symptomatic rectocele- plan for posterior repair. (We discussed that her partner's penis size is average).   She understands the risks of surgery, including, but not to infection, bleeding, DVTs, damage to bowel, bladder, ureters. She wishes to proceed.    Harland JAYSON Birkenhead 09/16/2024, 7:11 AM

## 2024-09-16 NOTE — Anesthesia Preprocedure Evaluation (Addendum)
 Anesthesia Evaluation  Patient identified by MRN, date of birth, ID band Patient awake    Reviewed: Allergy & Precautions, NPO status , Patient's Chart, lab work & pertinent test results  History of Anesthesia Complications Negative for: history of anesthetic complications  Airway Mallampati: I  TM Distance: >3 FB Neck ROM: full    Dental no notable dental hx.    Pulmonary neg pulmonary ROS   Pulmonary exam normal        Cardiovascular negative cardio ROS Normal cardiovascular exam     Neuro/Psych negative neurological ROS  negative psych ROS   GI/Hepatic Neg liver ROS,GERD  Medicated and Controlled,,  Endo/Other  negative endocrine ROS    Renal/GU negative Renal ROS  negative genitourinary   Musculoskeletal   Abdominal   Peds  Hematology negative hematology ROS (+)   Anesthesia Other Findings Past Medical History: No date: GERD (gastroesophageal reflux disease) No date: Hallux abducto valgus, right No date: Ovarian mass, left No date: Rectocele No date: Vaginal prolapse  Past Surgical History: No date: ABDOMINAL HYSTERECTOMY 05/13/2022: COLONOSCOPY WITH PROPOFOL ; N/A     Comment:  Procedure: COLONOSCOPY WITH PROPOFOL ;  Surgeon: Therisa Bi, MD;  Location: [facility] ENDOSCOPY;  Service:               Gastroenterology;  Laterality: N/A; 05/13/2022: ESOPHAGOGASTRODUODENOSCOPY; N/A     Comment:  Procedure: ESOPHAGOGASTRODUODENOSCOPY (EGD);  Surgeon:               Therisa Bi, MD;  Location: [facility] ENDOSCOPY;  Service:               Gastroenterology;  Laterality: N/A; 11/17/2021: ESOPHAGOGASTRODUODENOSCOPY (EGD) WITH PROPOFOL ; N/A     Comment:  Procedure: ESOPHAGOGASTRODUODENOSCOPY (EGD) WITH               PROPOFOL ;  Surgeon: Maryruth Ole DASEN, MD;  Location:               [facility] ENDOSCOPY;  Service: Endoscopy;  Laterality: N/A; No date: Right Lapidus bunionectomy  BMI    Body Mass Index: 22.65  kg/m      Reproductive/Obstetrics negative OB ROS                              Anesthesia Physical Anesthesia Plan  ASA: 2  Anesthesia Plan: General ETT   Post-op Pain Management: Toradol IV (intra-op)*, Ofirmev  IV (intra-op)* and Dilaudid IV   Induction: Intravenous  PONV Risk Score and Plan: 3 and Ondansetron , Dexamethasone, Midazolam  and Treatment may vary due to age or medical condition  Airway Management Planned: Oral ETT  Additional Equipment:   Intra-op Plan:   Post-operative Plan: Extubation in OR  Informed Consent: I have reviewed the patients History and Physical, chart, labs and discussed the procedure including the risks, benefits and alternatives for the proposed anesthesia with the patient or authorized representative who has indicated his/her understanding and acceptance.     Dental Advisory Given  Plan Discussed with: Anesthesiologist, CRNA and Surgeon  Anesthesia Plan Comments: (Patient consented for risks of anesthesia including but not limited to:  - adverse reactions to medications - damage to eyes, teeth, lips or other oral mucosa - nerve damage due to positioning  - sore throat or hoarseness - Damage to heart, brain, nerves, lungs, other parts of body or loss of life  Patient voiced understanding and assent.)  Anesthesia Quick Evaluation

## 2024-09-16 NOTE — Anesthesia Procedure Notes (Addendum)
 Procedure Name: Intubation Date/Time: 09/16/2024 7:38 AM  Performed by: Myra Lawless, CRNAPre-anesthesia Checklist: Patient identified, Patient being monitored, Timeout performed, Emergency Drugs available and Suction available Patient Re-evaluated:Patient Re-evaluated prior to induction Oxygen Delivery Method: Circle system utilized Preoxygenation: Pre-oxygenation with 100% oxygen Induction Type: IV induction Ventilation: Mask ventilation without difficulty Laryngoscope Size: Mac and 3 Grade View: Grade I Tube type: Oral Tube size: 6.5 mm Number of attempts: 1 Airway Equipment and Method: Stylet Placement Confirmation: ETT inserted through vocal cords under direct vision, positive ETCO2 and breath sounds checked- equal and bilateral Secured at: 19 cm Tube secured with: Tape Dental Injury: Teeth and Oropharynx as per pre-operative assessment

## 2024-09-16 NOTE — Anesthesia Postprocedure Evaluation (Signed)
 Anesthesia Post Note  Patient: Visual Merchandiser  Procedure(s) Performed: SALPINGO-OOPHORECTOMY, BILATERAL, LAPAROSCOPIC (Bilateral) COLPORRHAPHY, POSTERIOR, FOR RECTOCELE REPAIR  Patient location during evaluation: PACU Anesthesia Type: General Level of consciousness: awake and alert Pain management: pain level controlled Vital Signs Assessment: post-procedure vital signs reviewed and stable Respiratory status: spontaneous breathing, nonlabored ventilation, respiratory function stable and patient connected to nasal cannula oxygen Cardiovascular status: blood pressure returned to baseline and stable Postop Assessment: no apparent nausea or vomiting Anesthetic complications: no   No notable events documented.   Last Vitals:  Vitals:   09/16/24 1012 09/16/24 1015  BP: 100/64 91/66  Pulse: 70 71  Resp: (!) 23 (!) 21  Temp: (!) 36.3 C   SpO2: 99% 100%    Last Pain:  Vitals:   09/16/24 1015  TempSrc:   PainSc: Asleep                 Lendia LITTIE Mae

## 2024-09-16 NOTE — Op Note (Addendum)
 09/16/2024  10:12 AM  PATIENT:  Mallory Jenkins  48 y.o. female  PRE-OPERATIVE DIAGNOSIS:  symptomatic rectocele, symptomatic left ovarian cyst  POST-OPERATIVE DIAGNOSIS: same  PROCEDURE:  Procedure(s): SALPINGO-OOPHORECTOMY, BILATERAL, LAPAROSCOPIC (Bilateral) COLPORRHAPHY, POSTERIOR, FOR RECTOCELE REPAIR (N/A)  SURGEON:  Surgeons and Role:    * Jeriann Sayres, Harland BROCKS, MD - Primary    * Leigh Sober, MD - Assisting   ANESTHESIA:   general and local  EBL:  10 mL   BLOOD ADMINISTERED:none  DRAINS: none   LOCAL MEDICATIONS USED:  MARCAINE      SPECIMEN:  Source of Specimen:  ovaries and tube  DISPOSITION OF SPECIMEN:  PATHOLOGY  COUNTS:  YES  TOURNIQUET:  * No tourniquets in log *  DICTATION: .Note written in EPIC  PLAN OF CARE: discharge from PACU  PATIENT DISPOSITION:  PACU - hemodynamically stable.   Delay start of Pharmacological VTE agent (>24hrs) due to surgical blood loss or risk of bleeding: not applicable    The risks, benefits, and alternatives of surgery were explained, accepted, and understood. Consents were signed. She was taken to the operating room and placed in the dorsal lithotomy position. When she was comfortable, general anesthesia was applied without complication. Her abdomen and vagina were prepped and draped in the usual sterile fashion. A bimanual exam revealed a large pelvic mass. A sponge stick was placed in her vagina. Her bladder was emptied with a Robinson catheter. Gloves were changed and attention was turned to the abdomen. 0.5% Marcaine  was used to inject at all the incision sites prior to making an incision. A 5 mm incision was made in the umbilicus. A varies needle was placed intraperitoneally. Low-flow CO2 was used to insufflate the abdomen to approximately 3 L. After an excellent pneumoperitoneum was established a 5 mm trocar was placed. Laparoscopy confirmed correct placement. Under direct laparoscopic visualization I placed a 5 mm port in her right  lower quadrant and a 11 mm port in the left lower quadrant taking care to avoid blood vessels. . She was placed in the Trendelenburg position. The pelvis was inspected. The right ovary was atrophic. Both oviducts were edematous. The left ovary was about 7 cm and appeared to be a simple cyst. There were several filmy adhesions to the cyst. I took these adhesions down with the harmonic scalpel. I began by removing the right adnexa with the harmonic scalpel, taking care to avoid the sidewall.  This specimen was removed with an endopouch. I then began to separate the left adnexa from its pelvic attatchments with the harmonic scalpel, again taking care to avoid the sidewall.   Excellent hemostasis was maintained throughout. I then placed the large ovary in a endopouch. I opened the cyst wall and suctioned out the clear cyst fluid.  The pelvis was inspected and noted to be hemostatic. Both ureters were visualized and noted to be functioning and were of normal size. I irrigated the pelvis with normal saline. The CO2 was allowed to escape from the abdomen and the ports were removed.  I grasped the fascia at the 10 mm site with Kocher clamps and closed it with 0 vicryl suture. No defects were palpable. A subcuticular closure was done with 4-0 Vicryl at all of the skin incisions. Steri-Strips were placed across the incision.  I then turned my attention to her vagina. I placed a Deaver in the vagina and inspected the posterior vagina. I grasped the top of the vaginal cuff with long Allis clamp. I used marcaine /saline mixture (  70 mL) to hydrodisect the vaginal epithelium from the underlying rectovaginal fascia. I made a triangular incision on the perineum and removed the overlying skin. I then made an incision from the introitus up towards the   top of the vaginal cuff. I attached T clamps along the cut edges. I then used a raytex and manual disection to separate the skin layers. I then closed the defect with interrupted 2-0  vicryl sutures. I removed the excess vaginal tissue. I closed the skin edges with running locking 0 vicryl suture. Excellent hemostasis was maintained. The then closed the perineal edges creating a excellent cosmetic effect. I was able to put 3 fingers in her vaigna.  She was extubated and taken to the recovery room in stable condition.  An experienced assistant was required given the standard of surgical care given the complexity of the case. This assistant was needed for exposure, dissection, suctioning, retraction, instrument exchange, assisting with delivery with administration of fundal pressure, and for overall help during the procedure.

## 2024-09-17 ENCOUNTER — Telehealth: Payer: Self-pay | Admitting: Obstetrics & Gynecology

## 2024-09-17 ENCOUNTER — Encounter: Payer: Self-pay | Admitting: Obstetrics & Gynecology

## 2024-09-17 NOTE — Telephone Encounter (Signed)
 I called to see how she is doing. She is doing well, no concerns.

## 2024-09-18 LAB — SURGICAL PATHOLOGY

## 2024-09-19 ENCOUNTER — Other Ambulatory Visit: Payer: Self-pay

## 2024-09-22 ENCOUNTER — Other Ambulatory Visit: Payer: Self-pay

## 2024-09-24 ENCOUNTER — Encounter: Admitting: Obstetrics & Gynecology

## 2024-10-07 ENCOUNTER — Ambulatory Visit
Admission: RE | Admit: 2024-10-07 | Discharge: 2024-10-07 | Disposition: A | Discharge status: Home / Self Care | Source: Ambulatory Visit | Attending: Obstetrics | Admitting: Obstetrics

## 2024-10-07 DIAGNOSIS — Z1231 Encounter for screening mammogram for malignant neoplasm of breast: Secondary | ICD-10-CM | POA: Diagnosis not present

## 2024-10-15 ENCOUNTER — Other Ambulatory Visit: Payer: Self-pay

## 2024-10-15 ENCOUNTER — Ambulatory Visit (INDEPENDENT_AMBULATORY_CARE_PROVIDER_SITE_OTHER): Admitting: Obstetrics & Gynecology

## 2024-10-15 ENCOUNTER — Encounter: Payer: Self-pay | Admitting: Obstetrics & Gynecology

## 2024-10-15 ENCOUNTER — Encounter: Payer: Self-pay | Admitting: Obstetrics

## 2024-10-15 VITALS — BP 102/69 | HR 82 | Ht 60.0 in | Wt 122.0 lb

## 2024-10-15 DIAGNOSIS — Z9889 Other specified postprocedural states: Secondary | ICD-10-CM

## 2024-10-15 MED ORDER — NORETHIN ACE-ETH ESTRAD-FE 1-20 MG-MCG(24) PO TABS
1.0000 | ORAL_TABLET | Freq: Every day | ORAL | 5 refills | Status: DC
  Filled 2024-10-15: qty 84, 84d supply, fill #0

## 2024-10-15 NOTE — Progress Notes (Signed)
    GYNECOLOGY PROGRESS NOTE  Subjective:    Patient ID: Mallory Jenkins, female    DOB: 02/04/1976, 48 y.o.   MRN: [id number]  HPI  Patient is a 48 y.o. 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 She is feeling something uncomfortable in her vagina. She has been good and has not put anything in her vagina.  The following portions of the patient's history were reviewed and updated as appropriate: allergies, current medications, past family history, past medical history, past social history, past surgical history, and problem list.  Review of Systems Pertinent items are noted in HPI.   Objective:   Blood pressure 102/69, pulse 82, height 5' (1.524 m), weight 122 lb (55.3 kg). Body mass index is 23.83 kg/m. Well nourished, well hydrated White female, no apparent distress She is ambulating and conversing normally. EG- good cosmetic effect, no more rectocele Spec exam reveals multiple intact sutures. I cut/removed these. Abd- incisions healing well. Suture still present at the RLQ incision. I removed these as well.  Assessment:   1. Post-operative state     2.     HRT- she is taking estradiol  1 mg. This has relieved most of her hot flashes, still some trouble sleeping. She will need a progestin for endometrial protection.  Plan:   1. Post-operative state (Primary) -doing well She can return to normal activities.   2. She will try OCPs instead of traditional HRT since she is young. I suggested vaginal approach to avoid first pass metabolism, also suggested cutting the pill in half. Loestrin  prescribed

## 2024-10-16 ENCOUNTER — Encounter: Payer: Self-pay | Admitting: Obstetrics & Gynecology

## 2024-10-16 ENCOUNTER — Other Ambulatory Visit: Payer: Self-pay

## 2024-10-16 ENCOUNTER — Other Ambulatory Visit: Payer: Self-pay | Admitting: Obstetrics & Gynecology

## 2024-10-16 MED ORDER — ESTRADIOL 1 MG PO TABS
1.0000 mg | ORAL_TABLET | Freq: Every day | ORAL | 5 refills | Status: AC
  Filled 2024-10-16 – 2024-12-13 (×5): qty 90, 90d supply, fill #0

## 2024-12-08 ENCOUNTER — Other Ambulatory Visit: Payer: Self-pay

## 2024-12-12 ENCOUNTER — Other Ambulatory Visit: Payer: Self-pay

## 2024-12-12 ENCOUNTER — Encounter: Payer: Self-pay | Admitting: Pharmacist

## 2024-12-13 ENCOUNTER — Other Ambulatory Visit: Payer: Self-pay

## 2024-12-24 ENCOUNTER — Telehealth: Admitting: Nurse Practitioner

## 2024-12-24 ENCOUNTER — Other Ambulatory Visit: Payer: Self-pay | Admitting: Gastroenterology

## 2024-12-24 ENCOUNTER — Other Ambulatory Visit: Payer: Self-pay

## 2024-12-24 DIAGNOSIS — B9789 Other viral agents as the cause of diseases classified elsewhere: Secondary | ICD-10-CM

## 2024-12-24 MED ORDER — FLUTICASONE PROPIONATE 50 MCG/ACT NA SUSP
2.0000 | Freq: Every day | NASAL | 0 refills | Status: AC
  Filled 2024-12-24 – 2024-12-27 (×2): qty 16, 30d supply, fill #0

## 2024-12-24 NOTE — Progress Notes (Signed)
 We are sorry that you are not feeling well.  Here is how we plan to help!  Based on what you have shared with me it looks like you have sinusitis.  Sinusitis is inflammation and infection in the sinus cavities of the head.  Based on your presentation I believe you most likely have Acute Viral Sinusitis.This is an infection most likely caused by a virus. There is not specific treatment for viral sinusitis other than to help you with the symptoms until the infection runs its course.  You may use an oral decongestant such as Mucinex D or if you have glaucoma or high blood pressure use plain Mucinex. Saline nasal spray help and can safely be used as often as needed for congestion, I have prescribed: Fluticasone  nasal spray two sprays in each nostril once a day  Some authorities believe that zinc sprays or the use of Echinacea may shorten the course of your symptoms.  Sinus infections are not as easily transmitted as other respiratory infection, however we still recommend that you avoid close contact with loved ones, especially the very young and elderly.  Remember to wash your hands thoroughly throughout the day as this is the number one way to prevent the spread of infection!  Home Care: Only take medications as instructed by your medical team. Do not take these medications with alcohol. A steam or ultrasonic humidifier can help congestion.  You can place a towel over your head and breathe in the steam from hot water coming from a faucet. Avoid close contacts especially the very young and the elderly. Cover your mouth when you cough or sneeze. Always remember to wash your hands.  Get Help Right Away If: You develop worsening fever or sinus pain. You develop a severe head ache or visual changes. Your symptoms persist after you have completed your treatment plan.  Make sure you Understand these instructions. Will watch your condition. Will get help right away if you are not doing well or get  worse.  Your e-visit answers were reviewed by a board certified advanced clinical practitioner to complete your personal care plan.  Depending on the condition, your plan could have included both over the counter or prescription medications.  If there is a problem please reply  once you have received a response from your provider.  Your safety is important to us .  If you have drug allergies check your prescription carefully.    You can use MyChart to ask questions about todays visit, request a non-urgent call back, or ask for a work or school excuse for 24 hours related to this e-Visit. If it has been greater than 24 hours you will need to follow up with your provider, or enter a new e-Visit to address those concerns.  You will get an e-mail in the next two days asking about your experience.  I hope that your e-visit has been valuable and will speed your recovery. Thank you for using e-visits.  I have spent 5 minutes in review of e-visit questionnaire, review and updating patient chart, medical decision making and response to patient.   Etta JINNY Sierras, NP

## 2024-12-27 ENCOUNTER — Other Ambulatory Visit: Payer: Self-pay

## 2024-12-27 ENCOUNTER — Other Ambulatory Visit (HOSPITAL_COMMUNITY): Payer: Self-pay

## 2024-12-27 ENCOUNTER — Encounter: Payer: Self-pay | Admitting: Nurse Practitioner

## 2024-12-27 DIAGNOSIS — K200 Eosinophilic esophagitis: Secondary | ICD-10-CM

## 2024-12-27 MED ORDER — PANTOPRAZOLE SODIUM 40 MG PO TBEC
40.0000 mg | DELAYED_RELEASE_TABLET | Freq: Two times a day (BID) | ORAL | 3 refills | Status: AC
  Filled 2024-12-27 (×2): qty 180, 90d supply, fill #0

## 2025-01-07 ENCOUNTER — Ambulatory Visit: Admitting: Obstetrics & Gynecology
